# Patient Record
Sex: Female | Born: 1980 | Race: White | Hispanic: No | Marital: Single | Smoking: Former smoker
Health system: Southern US, Community
[De-identification: ages and names within clinical notes are randomized; demographics above are authoritative.]

## PROBLEM LIST (undated history)

## (undated) DIAGNOSIS — I10 Essential (primary) hypertension: Secondary | ICD-10-CM

## (undated) DIAGNOSIS — M1990 Unspecified osteoarthritis, unspecified site: Secondary | ICD-10-CM

## (undated) DIAGNOSIS — G43909 Migraine, unspecified, not intractable, without status migrainosus: Secondary | ICD-10-CM

## (undated) DIAGNOSIS — E119 Type 2 diabetes mellitus without complications: Secondary | ICD-10-CM

## (undated) DIAGNOSIS — K8020 Calculus of gallbladder without cholecystitis without obstruction: Secondary | ICD-10-CM

## (undated) DIAGNOSIS — K760 Fatty (change of) liver, not elsewhere classified: Secondary | ICD-10-CM

## (undated) HISTORY — DX: Fatty (change of) liver, not elsewhere classified: K76.0

## (undated) HISTORY — DX: Type 2 diabetes mellitus without complications: E11.9

## (undated) HISTORY — DX: Migraine, unspecified, not intractable, without status migrainosus: G43.909

## (undated) HISTORY — PX: TONSILLECTOMY: SHX5217

## (undated) HISTORY — DX: Calculus of gallbladder without cholecystitis without obstruction: K80.20

## (undated) HISTORY — DX: Essential (primary) hypertension: I10

## (undated) HISTORY — DX: Unspecified osteoarthritis, unspecified site: M19.90

---

## 2004-12-24 ENCOUNTER — Emergency Department (HOSPITAL_COMMUNITY): Admission: EM | Admit: 2004-12-24 | Discharge: 2004-12-24 | Payer: Self-pay | Admitting: Emergency Medicine

## 2005-01-06 ENCOUNTER — Encounter: Admission: RE | Admit: 2005-01-06 | Discharge: 2005-01-06 | Payer: Self-pay | Admitting: Internal Medicine

## 2009-10-12 ENCOUNTER — Encounter: Payer: Self-pay | Admitting: Family Medicine

## 2009-10-12 ENCOUNTER — Ambulatory Visit: Payer: Self-pay | Admitting: Family Medicine

## 2009-10-12 ENCOUNTER — Other Ambulatory Visit: Admission: RE | Admit: 2009-10-12 | Discharge: 2009-10-12 | Payer: Self-pay | Admitting: Family Medicine

## 2009-10-12 DIAGNOSIS — F17200 Nicotine dependence, unspecified, uncomplicated: Secondary | ICD-10-CM

## 2009-10-12 DIAGNOSIS — I10 Essential (primary) hypertension: Secondary | ICD-10-CM | POA: Insufficient documentation

## 2009-10-15 ENCOUNTER — Encounter (INDEPENDENT_AMBULATORY_CARE_PROVIDER_SITE_OTHER): Payer: Self-pay | Admitting: *Deleted

## 2009-11-21 ENCOUNTER — Ambulatory Visit (HOSPITAL_BASED_OUTPATIENT_CLINIC_OR_DEPARTMENT_OTHER): Admission: RE | Admit: 2009-11-21 | Discharge: 2009-11-21 | Payer: Self-pay | Admitting: Specialist

## 2010-05-21 ENCOUNTER — Ambulatory Visit: Payer: Self-pay | Admitting: Family Medicine

## 2010-05-21 DIAGNOSIS — J019 Acute sinusitis, unspecified: Secondary | ICD-10-CM

## 2010-08-14 ENCOUNTER — Telehealth (INDEPENDENT_AMBULATORY_CARE_PROVIDER_SITE_OTHER): Payer: Self-pay | Admitting: *Deleted

## 2010-10-15 ENCOUNTER — Ambulatory Visit: Payer: Self-pay | Admitting: Family Medicine

## 2010-10-15 ENCOUNTER — Other Ambulatory Visit: Admission: RE | Admit: 2010-10-15 | Discharge: 2010-10-15 | Payer: Self-pay | Admitting: Family Medicine

## 2010-10-21 LAB — CONVERTED CEMR LAB
AST: 27 units/L (ref 0–37)
Alkaline Phosphatase: 43 units/L (ref 39–117)
BUN: 9 mg/dL (ref 6–23)
Basophils Absolute: 0 10*3/uL (ref 0.0–0.1)
Calcium: 9 mg/dL (ref 8.4–10.5)
Creatinine, Ser: 0.7 mg/dL (ref 0.4–1.2)
Eosinophils Absolute: 0.2 10*3/uL (ref 0.0–0.7)
GFR calc non Af Amer: 99.9 mL/min (ref >60)
Glucose, Bld: 93 mg/dL (ref 70–99)
HDL: 37.3 mg/dL — ABNORMAL LOW (ref >39.00)
Hemoglobin: 13.8 g/dL (ref 12.0–15.0)
Lymphocytes Relative: 26.3 % (ref 12.0–46.0)
MCHC: 34.7 g/dL (ref 30.0–36.0)
Monocytes Relative: 4.1 % (ref 3.0–12.0)
Neutrophils Relative %: 67.1 % (ref 43.0–77.0)
Platelets: 264 10*3/uL (ref 150.0–400.0)
RDW: 12.6 % (ref 11.5–14.6)
Sodium: 142 meq/L (ref 135–145)
Total Bilirubin: 0.4 mg/dL (ref 0.3–1.2)
Triglycerides: 101 mg/dL (ref 0.0–149.0)
VLDL: 20.2 mg/dL (ref 0.0–40.0)

## 2010-10-29 HISTORY — PX: KNEE ARTHROSCOPY: SHX127

## 2010-11-07 ENCOUNTER — Telehealth (INDEPENDENT_AMBULATORY_CARE_PROVIDER_SITE_OTHER): Payer: Self-pay | Admitting: *Deleted

## 2010-11-19 ENCOUNTER — Telehealth (INDEPENDENT_AMBULATORY_CARE_PROVIDER_SITE_OTHER): Payer: Self-pay | Admitting: *Deleted

## 2011-01-28 NOTE — Assessment & Plan Note (Signed)
Summary: cpx/pap/fasting//kn   Vital Signs:  Patient profile:   30 year old female Menstrual status:  regular LMP:     09/24/2010 Height:      67 inches Weight:      243.4 pounds BMI:     38.26 Temp:     98.8 degrees F oral Pulse rate:   92 / minute Pulse rhythm:   regular BP sitting:   126 / 90  (right arm) Cuff size:   large  Vitals Entered By: Almeta Monas CMA Duncan Dull) (October 15, 2010 8:38 AM) CC: CPX/Fasting and Pap LMP (date): 09/24/2010     Menstrual Status regular Enter LMP: 09/24/2010 Last PAP Result NEGATIVE FOR INTRAEPITHELIAL LESIONS OR MALIGNANCY.   History of Present Illness: Pt here for cpe, labs and pap.  No complaints.    Preventive Screening-Counseling & Management  Alcohol-Tobacco     Alcohol drinks/day: <1     Smoking Status: current     Smoking Cessation Counseling: YES     Smoke Cessation Stage: precontemplative     Packs/Day: 0.5     Year Started: 2000  Caffeine-Diet-Exercise     Caffeine use/day: 32 oz cup a day     Diet Comments: not following diet     Does Patient Exercise: no     Exercise Counseling: to improve exercise regimen  Hep-HIV-STD-Contraception     Dental Visit-last 6 months yes     Dental Care Counseling: not indicated; dental care within six months     SBE monthly: yes     SBE Education/Counseling: not indicated; SBE done regularly  Safety-Violence-Falls     Seat Belt Use: yes      Sexual History:  same sex encounters and One partner.        Drug Use:  no.    Current Medications (verified): 1)  Benicar Hct 20-12.5 Mg Tabs (Olmesartan Medoxomil-Hctz) .Marland Kitchen.. 1 By Mouth Once Daily 2)  Low-Ogestrel 0.3-30 Mg-Mcg Tabs (Norgestrel-Ethinyl Estradiol) .Marland Kitchen.. 1 By Mouth Daily. 3)  Claritin-D 12 Hour 5-120 Mg Xr12h-Tab (Loratadine-Pseudoephedrine) .Marland Kitchen.. 1 By Mouth Daily 4)  Multivitamins  Tabs (Multiple Vitamin) .Marland Kitchen.. 1 By Mouth Daily 5)  Fish Oil 1200 Mg Caps (Omega-3 Fatty Acids) .Marland Kitchen.. 1 By Mouth Daily.  Allergies  (verified): 1)  ! Keflex 2)  ! 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 Heart Disease  Family History of Alcoholism/Addiction Family History of Arthritis Family History Diabetes 1st degree relative Family History High cholesterol Family History Hypertension Family History Lung cancer Family History of Stroke F 1st degree relative <60  Social History: Reviewed history from 10/12/2009 and no changes required. 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 Eyes:  Denies blurring, discharge, double vision, eye irritation, eye pain, halos, itching, light sensitivity, red eye, vision loss-1 eye, and vision loss-both eyes; optho q1y-- + contacts. ENT:  Denies decreased hearing, difficulty swallowing, ear discharge, earache,  hoarseness, nasal congestion, nosebleeds, postnasal drainage, ringing in ears, sinus pressure, and sore throat. CV:  Denies bluish discoloration of lips or nails, chest pain or discomfort, difficulty breathing at night, difficulty breathing while lying down, fainting, fatigue, leg cramps with exertion, lightheadness, near fainting, palpitations, shortness of breath with exertion, swelling of feet, swelling of hands, and weight gain. Resp:  Denies chest discomfort, chest pain with inspiration, cough, coughing up blood, excessive snoring, hypersomnolence, morning headaches, pleuritic, shortness of breath, sputum productive, and wheezing. GI:  Denies abdominal pain, bloody stools, change in bowel habits, constipation, dark tarry stools, diarrhea, excessive appetite, gas, hemorrhoids, indigestion, loss of appetite, nausea, vomiting, vomiting blood, and yellowish skin color. GU:  Denies abnormal vaginal bleeding, decreased libido, discharge, dysuria, genital sores, hematuria, incontinence, nocturia, urinary frequency, and urinary hesitancy. MS:  Denies joint pain, joint redness, joint swelling, loss of strength, low back pain, mid back pain, muscle aches, muscle , cramps, muscle weakness, stiffness, and thoracic pain. Derm:  Denies changes in color of skin, changes in nail beds, dryness, excessive perspiration, flushing, hair loss, insect bite(s), itching, lesion(s), poor wound healing, and rash. Neuro:  Denies brief paralysis, difficulty with concentration, disturbances in coordination, falling down, headaches, inability to speak, memory loss, numbness, poor balance, seizures, sensation of room spinning, tingling, tremors, visual disturbances, and weakness. Psych:  Denies alternate hallucination ( auditory/visual), anxiety, depression, easily angered, easily tearful, irritability, mental problems, panic attacks, sense of great danger, suicidal thoughts/plans, thoughts of violence, unusual visions or sounds, and  thoughts /plans of harming others. Endo:  Denies cold intolerance, excessive hunger, excessive thirst, excessive urination, heat intolerance, polyuria, and weight change. Heme:  Denies abnormal bruising, bleeding, enlarge lymph nodes, fevers, pallor, and skin discoloration. Allergy:  Denies hives or rash, itching eyes, persistent infections, seasonal allergies, and sneezing.  Physical Exam  General:  Well-developed,well-nourished,in no acute distress; alert,appropriate and cooperative throughout examination Head:  Normocephalic and atraumatic without obvious abnormalities. No apparent alopecia or balding. Eyes:  vision grossly intact, pupils equal, pupils round, pupils reactive to light, and no injection.   Ears:  External ear exam shows no significant lesions or deformities.  Otoscopic examination reveals clear canals, tympanic membranes are intact bilaterally without bulging, retraction, inflammation or discharge. Hearing is grossly normal bilaterally. Nose:  External nasal examination shows no deformity or inflammation. Nasal mucosa are pink and moist without lesions or exudates. Mouth:  Oral mucosa and oropharynx without lesions or exudates.  Teeth in good repair. Neck:  No deformities, masses, or tenderness noted. Chest Wall:  No deformities, masses, or tenderness noted. Breasts:  No mass, nodules, thickening, tenderness, bulging, retraction, inflamation, nipple discharge or skin changes noted.   Lungs:  Normal respiratory effort, chest expands symmetrically. Lungs are clear to auscultation, no crackles or wheezes. Heart:  normal rate and no murmur.   Abdomen:  Bowel sounds positive,abdomen soft and non-tender without masses, organomegaly or hernias noted. Genitalia:  Pelvic Exam:        External: normal female genitalia without lesions or masses        Vagina: normal without lesions or masses  Cervix: normal without lesions or masses        Adnexa: normal bimanual exam without  masses or fullness        Uterus: normal by palpation        Pap smear: performed Msk:  normal ROM, no joint tenderness, no joint swelling, no joint warmth, no redness over joints, no joint deformities, no joint instability, and no crepitation.   Pulses:  R and L carotid,radial,femoral,dorsalis pedis and posterior tibial pulses are full and equal bilaterally Extremities:  No clubbing, cyanosis, edema, or deformity noted with normal full range of motion of all joints.   Neurologic:  No cranial nerve deficits noted. Station and gait are normal. Plantar reflexes are down-going bilaterally. DTRs are symmetrical throughout. Sensory, motor and coordinative functions appear intact. Skin:  Intact without suspicious lesions or rashes Cervical Nodes:  No lymphadenopathy noted Axillary Nodes:  No palpable lymphadenopathy Psych:  Cognition and judgment appear intact. Alert and cooperative with normal attention span and concentration. No apparent delusions, illusions, hallucinations   Impression & Recommendations:  Problem # 1:  PREVENTIVE HEALTH CARE (ICD-V70.0) check labs ghm utd  Orders: Venipuncture ([address]109) TLB-Lipid Panel (80061-LIPID) TLB-BMP (Basic Metabolic Panel-BMET) (80048-METABOL) TLB-CBC Platelet - w/Differential (85025-CBCD) TLB-Hepatic/Liver Function Pnl (80076-HEPATIC) TLB-TSH (Thyroid Stimulating Hormone) (84443-TSH)  Problem # 2:  HYPERTENSION (ICD-401.9)  Her updated medication list for this problem includes:    Benicar Hct 20-12.5 Mg Tabs (Olmesartan medoxomil-hctz) .Marland Kitchen... 1 by mouth once daily  Orders: Venipuncture (60454) TLB-Lipid Panel (80061-LIPID) TLB-BMP (Basic Metabolic Panel-BMET) (80048-METABOL) TLB-CBC Platelet - w/Differential (85025-CBCD) TLB-Hepatic/Liver Function Pnl (80076-HEPATIC) TLB-TSH (Thyroid Stimulating Hormone) (84443-TSH)  BP today: 126/90 Prior BP: 122/74 (05/21/2010)  Problem # 3:  TOBACCO USER (ICD-305.1)  The following medications were  removed from the medication list:    Chantix Starting Month Pak 0.5 Mg X 11 & 1 Mg X 42 Tabs (Varenicline tartrate) .Marland Kitchen... As directed  Encouraged smoking cessation and discussed different methods for smoking cessation.   Orders: Tobacco use cessation intermediate 3-10 minutes (99406)  Complete Medication List: 1)  Benicar Hct 20-12.5 Mg Tabs (Olmesartan medoxomil-hctz) .Marland Kitchen.. 1 by mouth once daily 2)  Low-ogestrel 0.3-30 Mg-mcg Tabs (Norgestrel-ethinyl estradiol) .Marland Kitchen.. 1 by mouth daily. 3)  Claritin-d 12 Hour 5-120 Mg Xr12h-tab (Loratadine-pseudoephedrine) .Marland Kitchen.. 1 by mouth daily 4)  Multivitamins Tabs (Multiple vitamin) .Marland Kitchen.. 1 by mouth daily 5)  Fish Oil 1200 Mg Caps (Omega-3 fatty acids) .Marland Kitchen.. 1 by mouth daily.  Patient Instructions: 1)  Please schedule a follow-up appointment 6 months Prescriptions: BENICAR HCT 20-12.5 MG TABS (OLMESARTAN MEDOXOMIL-HCTZ) 1 by mouth once daily  #90 x 3   Entered and Authorized by:   Loreen Freud DO   Signed by:   Loreen Freud DO on 10/15/2010   Method used:   [facility]        [facility] [address] [phone]* (retail)       [address]       [place], [place]  [address]       Ph: [phone] or [phone]       Fax: [phone]   RxID:   [id number] BENICAR HCT 40-25 MG TABS (OLMESARTAN MEDOXOMIL-HCTZ) 1 by mouth once daily  #30 x 2   Entered and Authorized by:   Loreen Freud DO   Signed by:   Loreen Freud DO on 10/15/2010   Method used:   Print then Give to Patient   RxID:   [id number]  Orders Added: 1)  Venipuncture [36415] 2)  TLB-Lipid Panel [80061-LIPID] 3)  TLB-BMP (Basic Metabolic Panel-BMET) [80048-METABOL] 4)  TLB-CBC Platelet - w/Differential [85025-CBCD] 5)  TLB-Hepatic/Liver Function Pnl [80076-HEPATIC] 6)  TLB-TSH (Thyroid Stimulating Hormone) [84443-TSH] 7)  Est. Patient 18-39 years [99395] 8)  Tobacco use cessation intermediate 3-10 minutes [99406]    Flu Vaccine Next Due:   Refused

## 2011-01-28 NOTE — Assessment & Plan Note (Signed)
Summary: sinus pressure//congestion//lch   Vital Signs:  Patient profile:   30 year old female Weight:      245 pounds Temp:     98.5 degrees F oral Pulse rate:   96 / minute Pulse rhythm:   regular BP sitting:   122 / 74  (left arm) Cuff size:   regular  Vitals Entered By: Army Fossa CMA (May 21, 2010 9:48 AM) CC: Pt here c/o HA, drainage in her throat, Head Congestion, Ears ache x 2 days. Has tried Sudafed-d, URI symptoms   History of Present Illness:       This is a 30 year old woman who presents with URI symptoms.  The symptoms began 1 week ago.  Pt taking claritin with no relief.  The patient complains of nasal congestion, purulent nasal discharge, sore throat, productive cough, earache, and sick contacts, but denies dry cough.  Associated symptoms include fever.  The patient also reports headache.  The patient denies itchy watery eyes, itchy throat, sneezing, seasonal symptoms, response to antihistamine, muscle aches, and severe fatigue.  The patient denies the following risk factors for Strep sinusitis: unilateral facial pain, unilateral nasal discharge, poor response to decongestant, double sickening, tooth pain, Strep exposure, tender adenopathy, and absence of cough.   Pt c/o B/L sinus pressure and teeth hurt.    Preventive Screening-Counseling & Management  Alcohol-Tobacco     Alcohol drinks/day: <1     Smoking Status: current     Smoking Cessation Counseling: YES     Smoke Cessation Stage: precontemplative     Packs/Day: 1.0     Year Started: 2000  Caffeine-Diet-Exercise     Caffeine use/day: 32 oz cup a day     Diet Comments: not following diet     Does Patient Exercise: no     Exercise Counseling: not indicated; exercise is adequate  Allergies: 1)  ! Keflex 2)  ! Pcn  Past History:  Past medical, surgical, family and social histories (including risk factors) reviewed for relevance to current acute and chronic problems.  Past Medical History: Reviewed  history from 10/12/2009 and no changes required. Arthritis High Blood Pressure Readings Migraines  Hypertension  Past Surgical History: Reviewed history from 10/12/2009 and no changes required. Tonsillectomy Discoid Miniscus  Family History: Reviewed history from 10/12/2009 and no changes required. Heart Disease  Family History of Alcoholism/Addiction Family History of Arthritis Family History Diabetes 1st degree relative Family History High cholesterol Family History Hypertension Family History Lung cancer Family History of Stroke F 1st degree relative <60  Social History: Reviewed history from 10/12/2009 and no changes required. 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  Otoscopic examination reveals clear canals, tympanic membranes are intact bilaterally without bulging, retraction, inflammation or discharge. Hearing is grossly normal bilaterally. Nose:  L frontal sinus tenderness, L maxillary sinus tenderness, R frontal sinus tenderness, and R maxillary sinus tenderness.   Mouth:  Oral mucosa and oropharynx without lesions or exudates.  Teeth in good repair. Neck:  No deformities, masses, or tenderness noted. Lungs:  Normal respiratory effort, chest expands symmetrically. Lungs are clear to auscultation, no crackles or wheezes. Heart:  normal rate and no murmur.   Psych:  Oriented X3 and normally interactive.  Impression & Recommendations:  Problem # 1:  SINUSITIS - ACUTE-NOS (ICD-461.9)  Her updated medication list for this problem includes:    Claritin-d 12 Hour 5-120 Mg Xr12h-tab (Loratadine-pseudoephedrine) .Marland Kitchen... 1 by mouth daily    Biaxin Xl 500 Mg Xr24h-tab  (Clarithromycin) .Marland Kitchen... 2 by mouth once daily with the biggest meal of the day    Nasonex 50 Mcg/act Susp (Mometasone furoate) .Marland Kitchen... 2 sprays each nostril once daily  Instructed on treatment. Call if symptoms persist or worsen.   Orders: Tobacco use cessation intermediate 3-10 minutes (40981)  Problem # 2:  TOBACCO USER (ICD-305.1)  Her updated medication list for this problem includes:    Chantix Starting Month Pak 0.5 Mg X 11 & 1 Mg X 42 Tabs (Varenicline tartrate) .Marland Kitchen... As directed  Encouraged smoking cessation and discussed different methods for smoking cessation.   Orders: Tobacco use cessation intermediate 3-10 minutes (99406)  Complete Medication List: 1)  Benicar Hct 20-12.5 Mg Tabs (Olmesartan medoxomil-hctz) .Marland Kitchen.. 1 by mouth once daily. 2)  Low-ogestrel 0.3-30 Mg-mcg Tabs (Norgestrel-ethinyl estradiol) .Marland Kitchen.. 1 by mouth daily. 3)  Claritin-d 12 Hour 5-120 Mg Xr12h-tab (Loratadine-pseudoephedrine) .Marland Kitchen.. 1 by mouth daily 4)  Multivitamins Tabs (Multiple vitamin) .Marland Kitchen.. 1 by mouth daily 5)  Fish Oil 1200 Mg Caps (Omega-3 fatty acids) .Marland Kitchen.. 1 by mouth daily. 6)  Biaxin Xl 500 Mg Xr24h-tab (Clarithromycin) .... 2 by mouth once daily with the biggest meal of the day 7)  Nasonex 50 Mcg/act Susp (Mometasone furoate) .... 2 sprays each nostril once daily 8)  Chantix Starting Month Pak 0.5 Mg X 11 & 1 Mg X 42 Tabs (Varenicline tartrate) .... As directed           Patient Instructions: 1)  Tobacco is very bad for your health and your loved ones ! You should stop smoking !    If you decide to take the chantix--make an appointment for 4 weeks later.  You can do It!!! 2)  Acute sinusitis symptoms for less than 10 days are not helped by antibiotics. Use warm moist compresses, and over the counter decongestants( only as directed). Call if no improvement in 5-7 days, sooner if increasing pain, fever, or new symptoms.  Prescriptions: CHANTIX STARTING MONTH PAK 0.5 MG X 11 & 1 MG X 42 TABS  (VARENICLINE TARTRATE) as directed  #1 x 0   Entered and Authorized by:   Loreen Freud DO   Signed by:   Loreen Freud DO on 05/21/2010   Method used:   Print then Give to Patient   RxID:   [id number] BIAXIN XL 500 MG XR24H-TAB (CLARITHROMYCIN) 2 by mouth once daily with the biggest meal of the day  #28 x 0   Entered and Authorized by:   Loreen Freud DO   Signed by:   Loreen Freud DO on 05/21/2010   Method used:   [facility]        [facility] [address] [phone]* (retail)       [address]       [place], [place]  [address]       Ph: [phone] or [phone]       Fax: [phone]   RxID:   [id number]

## 2011-01-28 NOTE — Progress Notes (Signed)
Summary: benicar refill   Phone Note Refill Request Call back at Home Phone [phone] Message from:  Patient on November 19, 2010 10:53 AM  Refills Requested: Medication #1:  BENICAR HCT 20-12.5 MG TABS 1 by mouth once daily   Dosage confirmed as above?Dosage Confirmed   Supply Requested: [facility] month [facility] [address] [address]  Next Appointment Scheduled: NONE Initial call taken by: Lavell Islam,  November 19, 2010 10:54 AM    Prescriptions: BENICAR HCT 20-12.5 MG TABS (OLMESARTAN MEDOXOMIL-HCTZ) 1 by mouth once daily  #90 x 0   Entered by:   Doristine Devoid CMA   Authorized by:   Loreen Freud DO   Signed by:   Doristine Devoid CMA on 11/19/2010   Method used:   Electronically to        [facility] [address] [phone]* (retail)       [address]       [place], [place]  [address]       Ph: [phone] or [phone]       Fax: [phone]   RxID:   [id number]

## 2011-01-28 NOTE — Progress Notes (Signed)
Summary: Refill Request  Phone Note Refill Request Message from:  Patient on August 14, 2010 12:50 PM  Refills Requested: Medication #1:  BENICAR HCT 20-12.5 MG TABS 1 by mouth once daily.   Dosage confirmed as above?Dosage Confirmed   Supply Requested: 1 month  Medication #2:  LOW-OGESTREL 0.3-30 MG-MCG TABS 1 by mouth daily.   Dosage confirmed as above?Dosage Confirmed   [facility]ly Requested: [facility] [address]  Next Appointment Scheduled: 10/15/10 Initial call taken by: Lavell Islam,  August 14, 2010 12:51 PM  Follow-up for Phone Call        spk with Pharmacy about rf on OCP and they adv that they were filled 11 times already and pt was out of refills...Marland KitchenMarland KitchenMarland Kitchen Almeta Monas CMA Duncan Dull)  August 14, 2010 1:58 PM     Prescriptions: LOW-OGESTREL 0.3-30 MG-MCG TABS (NORGESTREL-ETHINYL ESTRADIOL) 1 by mouth daily.  #1 x 3   Entered by:   Almeta Monas CMA (AAMA)   Authorized by:   Loreen Freud DO   Signed by:   Almeta Monas CMA (AAMA) on 08/14/2010   Method used:   Faxed to ...       [facility] [address] [phone]* (retail)       [address]       [place], [place]  [address]       Ph: [phone] or [phone]       Fax: [phone]   RxID:   [id number] BENICAR HCT 20-12.5 MG TABS (OLMESARTAN MEDOXOMIL-HCTZ) 1 by mouth once daily.  #30 Tablet x 4   Entered by:   Almeta Monas CMA (AAMA)   Authorized by:   Loreen Freud DO   Signed by:   Almeta Monas CMA (AAMA) on 08/14/2010   Method used:   Faxed to ...       [facility] [address] [phone]* (retail)       [address]       [place], [place]  [address]       Ph: [phone] or [phone]       Fax: [phone]   RxID:   [id number]

## 2011-01-28 NOTE — Progress Notes (Signed)
Summary: rx for chantix  Phone Note Call from Patient Call back at Home Phone [phone]   Caller: Patient Summary of Call: patient discussed chantix with dr Laury Axon -she [facility] [address] Initial call taken by: Okey Regal [place],  November 07, 2010 4:46 PM    New/Updated Medications: CHANTIX STARTING MONTH PAK 0.5 MG X 11 & 1 MG X 42 TABS (VARENICLINE TARTRATE) use as directed Prescriptions: CHANTIX STARTING MONTH PAK 0.5 MG X 11 & 1 MG X 42 TABS (VARENICLINE TARTRATE) use as directed  #1 x 0   Entered by:   Almeta Monas CMA (AAMA)   Authorized by:   Loreen Freud DO   Signed by:   Almeta Monas CMA (AAMA) on 11/07/2010   Method used:   Electronically to        [facility] [address] [phone]* (retail)       [address]       [place], [place]  [address]       Ph: [phone] or [phone]       Fax: [phone]   RxID:   [id number]

## 2011-02-25 ENCOUNTER — Telehealth (INDEPENDENT_AMBULATORY_CARE_PROVIDER_SITE_OTHER): Payer: Self-pay | Admitting: *Deleted

## 2011-03-05 ENCOUNTER — Ambulatory Visit (INDEPENDENT_AMBULATORY_CARE_PROVIDER_SITE_OTHER): Payer: Managed Care, Other (non HMO) | Admitting: Family Medicine

## 2011-03-05 ENCOUNTER — Other Ambulatory Visit: Payer: Self-pay | Admitting: Family Medicine

## 2011-03-05 ENCOUNTER — Ambulatory Visit (INDEPENDENT_AMBULATORY_CARE_PROVIDER_SITE_OTHER)
Admission: RE | Admit: 2011-03-05 | Discharge: 2011-03-05 | Disposition: A | Discharge status: Home / Self Care | Payer: Managed Care, Other (non HMO) | Source: Ambulatory Visit | Attending: Family Medicine | Admitting: Family Medicine

## 2011-03-05 ENCOUNTER — Encounter: Payer: Self-pay | Admitting: Family Medicine

## 2011-03-05 DIAGNOSIS — R059 Cough, unspecified: Secondary | ICD-10-CM

## 2011-03-05 DIAGNOSIS — R05 Cough: Secondary | ICD-10-CM

## 2011-03-05 DIAGNOSIS — Z87891 Personal history of nicotine dependence: Secondary | ICD-10-CM

## 2011-03-05 DIAGNOSIS — I10 Essential (primary) hypertension: Secondary | ICD-10-CM

## 2011-03-05 DIAGNOSIS — J019 Acute sinusitis, unspecified: Secondary | ICD-10-CM

## 2011-03-06 NOTE — Progress Notes (Signed)
Summary: refill  Phone Note Refill Request   Refills Requested: Medication #1:  BENICAR HCT 20-12.5 MG TABS 1 by mouth once daily  Medication #2:  LOW-OGESTREL 0.3-30 MG-MCG TABS 1 by mouth daily.    Prescriptions: LOW-OGESTREL 0.3-30 MG-MCG TABS (NORGESTREL-ETHINYL ESTRADIOL) 1 by mouth daily.  #28 Tablet x 8   Entered by:   Jeremy Johann CMA   Authorized by:   Loreen Freud DO   Signed by:   Jeremy Johann CMA on 02/25/2011   Method used:   Re-Faxed to ...       [facility] [address] [phone]* (retail)       [address]       [place], [place]  [address]       Ph: [phone] or [phone]       Fax: [phone]   RxID:   [id number] BENICAR HCT 20-12.5 MG TABS (OLMESARTAN MEDOXOMIL-HCTZ) 1 by mouth once daily  #90 x 2   Entered by:   Jeremy Johann CMA   Authorized by:   Loreen Freud DO   Signed by:   Jeremy Johann CMA on 02/25/2011   Method used:   Re-Faxed to ...       [facility] [address] [phone]* (retail)       [address]       [place], [place]  [address]       Ph: [phone] or [phone]       Fax: [phone]   RxID:   [id number]

## 2011-03-11 NOTE — Assessment & Plan Note (Signed)
Summary: cough/congested/cbs   Vital Signs:  Patient profile:   30 year old female Menstrual status:  regular Weight:      253.4 pounds Temp:     97.5 degrees F oral BP sitting:   124 / 86  (right arm) Cuff size:   large  Vitals Entered By: Almeta Monas CMA Duncan Dull) (March 05, 2011 11:52 AM) CC: c/o URI X3 days with green phelgm, Cough   History of Present Illness:  Cough      This is a 30 year old woman who presents with Cough.  The symptoms began 3 days ago.  + cough and nasal congestion for 3-4 days.  Cough since Novemer.  The patient reports productive cough, but denies non-productive cough, pleuritic chest pain, shortness of breath, wheezing, exertional dyspnea, fever, hemoptysis, and malaise.  Associated symtpoms include cold/URI symptoms, sore throat, and nasal congestion.  The patient denies the following symptoms: chronic rhinitis, weight loss, acid reflux symptoms, and peripheral edema.  The cough is worse with activity and lying down.  Ineffective prior treatments have included OTC cough medication.    Preventive Screening-Counseling & Management  Alcohol-Tobacco     Smoking Status: quit     Smoke Cessation Stage: quit     Year Quit: 2011  Current Medications (verified): 1)  Benicar Hct 20-12.5 Mg Tabs (Olmesartan Medoxomil-Hctz) .Marland Kitchen.. 1 By Mouth Once Daily 2)  Low-Ogestrel 0.3-30 Mg-Mcg Tabs (Norgestrel-Ethinyl Estradiol) .Marland Kitchen.. 1 By Mouth Daily. 3)  Claritin-D 12 Hour 5-120 Mg Xr12h-Tab (Loratadine-Pseudoephedrine) .Marland Kitchen.. 1 By Mouth Daily 4)  Multivitamins  Tabs (Multiple Vitamin) .Marland Kitchen.. 1 By Mouth Daily 5)  Fish Oil 1200 Mg Caps (Omega-3 Fatty Acids) .Marland Kitchen.. 1 By Mouth Daily. 6)  Biaxin Xl 500 Mg Xr24h-Tab (Clarithromycin) .... 2 By Mouth Once Daily 7)  Mucinex Maximum Strength 1200 Mg Xr12h-Tab (Guaifenesin)  Allergies (verified): 1)  ! Keflex 2)  ! Pcn  Past History:  Past medical, surgical, family and social histories (including risk factors) reviewed for relevance  to current acute and chronic problems.  Past Medical History: Reviewed history from 10/12/2009 and no changes required. Arthritis High Blood Pressure Readings Migraines  Hypertension  Past Surgical History: Reviewed history from 10/15/2010 and no changes required. Tonsillectomy Discoid Miniscus arthroscopic R knee  10/2010  Family History: Reviewed history from 10/12/2009 and no changes required. Heart Disease  Family History of Alcoholism/Addiction Family History of Arthritis Family History Diabetes 1st degree relative Family History High cholesterol Family History Hypertension Family History Lung cancer Family History of Stroke F 1st degree relative <60  Social History: Reviewed history from 10/12/2009 and no changes required. 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  Otoscopic examination reveals clear canals, tympanic membranes are intact bilaterally without bulging, retraction, inflammation or discharge. Hearing is grossly normal bilaterally. Nose:  no external deformity, L frontal sinus tenderness, L maxillary sinus tenderness, R frontal sinus tenderness, and R maxillary sinus tenderness.   Mouth:  pharyngeal erythema and postnasal drip.   Neck:  No deformities, masses, or tenderness noted. Lungs:  Normal respiratory effort, chest expands symmetrically. Lungs are clear to auscultation, no crackles or wheezes. Heart:  normal rate and no murmur.     Impression & Recommendations:  Problem #  1:  SINUSITIS - ACUTE-NOS (ICD-461.9)  Her updated medication list for this problem includes:    Claritin-d 12 Hour 5-120 Mg Xr12h-tab  (Loratadine-pseudoephedrine) .Marland Kitchen... 1 by mouth daily    Biaxin Xl 500 Mg Xr24h-tab (Clarithromycin) .Marland Kitchen... 2 by mouth once daily    Mucinex Maximum Strength 1200 Mg Xr12h-tab (Guaifenesin)  Problem # 2:  COUGH (ICD-786.2)  Orders: T-2 View CXR (71020TC)  Problem # 3:  TOBACCO USE, QUIT (ICD-V15.82)  Complete Medication List: 1)  Benicar Hct 20-12.5 Mg Tabs (Olmesartan medoxomil-hctz) .Marland Kitchen.. 1 by mouth once daily 2)  Low-ogestrel 0.3-30 Mg-mcg Tabs (Norgestrel-ethinyl estradiol) .Marland Kitchen.. 1 by mouth daily. 3)  Claritin-d 12 Hour 5-120 Mg Xr12h-tab (Loratadine-pseudoephedrine) .Marland Kitchen.. 1 by mouth daily 4)  Multivitamins Tabs (Multiple vitamin) .Marland Kitchen.. 1 by mouth daily 5)  Fish Oil 1200 Mg Caps (Omega-3 fatty acids) .Marland Kitchen.. 1 by mouth daily. 6)  Biaxin Xl 500 Mg Xr24h-tab (Clarithromycin) .... 2 by mouth once daily 7)  Mucinex Maximum Strength 1200 Mg Xr12h-tab (Guaifenesin) Prescriptions: BIAXIN XL 500 MG XR24H-TAB (CLARITHROMYCIN) 2 by mouth once daily  #28 x 0   Entered and Authorized by:   Loreen Freud DO   Signed by:   Loreen Freud DO on 03/05/2011   Method used:   Electronically to        [facility] [address] [phone]* (retail)       [address]       [place], [place]  [address]       Ph: [phone] or [phone]       Fax: [phone]   RxID:   [id number]    Orders Added: 1)  T-2 View CXR [71020TC] 2)  Est. Patient Level III [95188]

## 2011-04-02 LAB — POCT I-STAT 4, (NA,K, GLUC, HGB,HCT)
Glucose, Bld: 104 mg/dL — ABNORMAL HIGH (ref 70–99)
Hemoglobin: 14.6 g/dL (ref 12.0–15.0)
Potassium: 3.8 mEq/L (ref 3.5–5.1)
Sodium: 141 mEq/L (ref 135–145)

## 2011-04-02 LAB — POCT PREGNANCY, URINE: Preg Test, Ur: NEGATIVE

## 2011-05-21 ENCOUNTER — Ambulatory Visit (INDEPENDENT_AMBULATORY_CARE_PROVIDER_SITE_OTHER): Payer: Managed Care, Other (non HMO) | Admitting: Family Medicine

## 2011-05-21 ENCOUNTER — Encounter: Payer: Self-pay | Admitting: Family Medicine

## 2011-05-21 VITALS — BP 130/90 | HR 116 | Temp 98.9°F

## 2011-05-21 DIAGNOSIS — J029 Acute pharyngitis, unspecified: Secondary | ICD-10-CM

## 2011-05-21 DIAGNOSIS — B9789 Other viral agents as the cause of diseases classified elsewhere: Secondary | ICD-10-CM

## 2011-05-21 NOTE — Progress Notes (Signed)
  Subjective:    Patient ID: Monica Perkins, female    DOB: [birth year], 30 y.o.   MRN: [id number]  HPI Sore throat- sxs started this AM.  Pain w/ swallowing.  Associated laryngitis.  No fevers.  No nasal congestion, cough.  Bilateral ear pain w/ swallowing.  + sick contacts.  Denies sinus pain/pressure.   Review of Systems For ROS see HPI     Objective:   Physical Exam  Constitutional: She appears well-developed and well-nourished. No distress.  HENT:  Head: Normocephalic and atraumatic.  Nose: Nose normal.  Mouth/Throat: Oropharynx is clear and moist. No oropharyngeal exudate.       TMs normal bilaterally No PND, no TTP over sinuses  Eyes: Conjunctivae and EOM are normal. Pupils are equal, round, and reactive to light.  Neck: Normal range of motion. Neck supple.  Cardiovascular: Normal rate, regular rhythm and normal heart sounds.   Pulmonary/Chest: Effort normal and breath sounds normal. No respiratory distress. She has no wheezes.  Lymphadenopathy:    She has no cervical adenopathy.          Assessment & Plan:

## 2011-05-21 NOTE — Patient Instructions (Signed)
This appears to be a virus and should improve w/ time Ibuprofen for sore throat (2 tabs every 6-8hrs) Drink plenty of fluids Vocal rest! Call with any questions or concerns Enjoy your vacation!!

## 2011-05-21 NOTE — Assessment & Plan Note (Signed)
Most likely viral.  No evidence of bacterial infxn on exam and rapid strep (-).  Reviewed supportive care and red flags that should prompt return.  Pt expressed understanding and is in agreement w/ plan.

## 2011-05-22 ENCOUNTER — Telehealth: Payer: Self-pay | Admitting: *Deleted

## 2011-05-22 MED ORDER — AZITHROMYCIN 250 MG PO TABS: 250.0000 mg | ORAL_TABLET | Freq: Every day | ORAL | Status: DC

## 2011-05-22 NOTE — Telephone Encounter (Signed)
She can have a Zpack although it is still most likely viral and can take 7-10 days to improve.

## 2011-05-22 NOTE — Telephone Encounter (Signed)
Pt seen on yesterday now c/o yellowish/green mucous. Pt request med to be called in.Please advise

## 2011-05-22 NOTE — Telephone Encounter (Signed)
I spoke w/ pt she is aware- called in Zpack.

## 2011-07-04 ENCOUNTER — Other Ambulatory Visit: Payer: Self-pay | Admitting: Orthopedic Surgery

## 2011-07-04 DIAGNOSIS — M25561 Pain in right knee: Secondary | ICD-10-CM

## 2011-07-07 ENCOUNTER — Ambulatory Visit
Admission: RE | Admit: 2011-07-07 | Discharge: 2011-07-07 | Disposition: A | Discharge status: Home / Self Care | Payer: Managed Care, Other (non HMO) | Source: Ambulatory Visit | Attending: Orthopedic Surgery | Admitting: Orthopedic Surgery

## 2011-07-07 DIAGNOSIS — M25561 Pain in right knee: Secondary | ICD-10-CM

## 2011-09-05 ENCOUNTER — Ambulatory Visit (HOSPITAL_BASED_OUTPATIENT_CLINIC_OR_DEPARTMENT_OTHER)
Admission: RE | Admit: 2011-09-05 | Discharge: 2011-09-05 | Disposition: A | Discharge status: Home / Self Care | Payer: Managed Care, Other (non HMO) | Source: Ambulatory Visit | Attending: Orthopedic Surgery | Admitting: Orthopedic Surgery

## 2011-09-05 DIAGNOSIS — Z01812 Encounter for preprocedural laboratory examination: Secondary | ICD-10-CM | POA: Insufficient documentation

## 2011-09-05 DIAGNOSIS — I10 Essential (primary) hypertension: Secondary | ICD-10-CM | POA: Insufficient documentation

## 2011-09-05 DIAGNOSIS — M23305 Other meniscus derangements, unspecified medial meniscus, unspecified knee: Secondary | ICD-10-CM | POA: Insufficient documentation

## 2011-09-05 DIAGNOSIS — F17200 Nicotine dependence, unspecified, uncomplicated: Secondary | ICD-10-CM | POA: Insufficient documentation

## 2011-09-05 DIAGNOSIS — M2240 Chondromalacia patellae, unspecified knee: Secondary | ICD-10-CM | POA: Insufficient documentation

## 2011-09-05 LAB — POCT I-STAT 4, (NA,K, GLUC, HGB,HCT)
HCT: 42 % (ref 36.0–46.0)
Hemoglobin: 14.3 g/dL (ref 12.0–15.0)
Potassium: 3.7 mEq/L (ref 3.5–5.1)
Sodium: 139 mEq/L (ref 135–145)

## 2011-09-16 NOTE — Op Note (Signed)
  NAME:  Monica Perkins, Monica Perkins                ACCOUNT NO.:  [id number]  MEDICAL RECORD NO.:  [id number]  LOCATION:                                 FACILITY:  PHYSICIAN:  Marlowe Kays, M.D.       DATE OF BIRTH:  DATE OF PROCEDURE: DATE OF DISCHARGE:                              OPERATIVE REPORT   PREOPERATIVE DIAGNOSIS:  Torn medial meniscus, right knee.  POSTOPERATIVE DIAGNOSES: 1. Torn medial meniscus. 2. 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  OPERATIONS: 1. Right knee arthroscopy. 2. Partial medial meniscectomy. 3. Shaving of medial femoral condyle. 4. Shaving of patella.  SURGEON:  Marlowe Kays, M.D.  ASSISTANT:  None.  ANESTHESIA:  General.  PATHOLOGY AND JUSTIFICATION FOR PROCEDURE:  She had a prior knee arthroscopy by one of my partners, Dr. Shelle Iron in November 2010 and felt that she had partial discoid medial meniscus, a partial medial meniscectomy was performed.  She reinjured her right knee moving a chicken coop on July 02, 2011.  MRI demonstrated preoperative diagnosis. Consequently, she is here for the above-mentioned surgery.  PROCEDURE IN DETAIL:  Satisfactory general anesthesia, Ace wrap and knee support to left lower extremity, pneumatic tourniquet applied to right lower extremity,  the leg Esmarched out nonsterilely, tourniquet inflated to 350 mmHg. Thigh stabilizer applied.  The right leg was then prepped with DuraPrep from stabilizer to ankle and draped in sterile field.  Time-out was performed.  Superior medial saline inflow.  First, through an anterolateral portal, the medial compartment of the knee joint was evaluated.  Immediately apparent was a significantly torn disrupted medial meniscus compatible with a discoid anatomy.  This was associated with grade 2 out of 4 chondromalacia medial femoral condyle, which I smoothed down with a 3.5 shaver.  I then performed partial medial  meniscectomy, shaving the meniscus back to a stable rim with a combination of baskets and a 3.5 shaver.  At the posterior curve, she had basically no meniscus remaining with resection back to the joint lining.  Looking at the medial gutter and suprapatellar area, there was patellar wear noted.  This was mainly in the lateral half.  I partially shaved it down through these portals, but had to complete the shaving with a reverse portal.  Final pictures were taken.  ACL was intact. Lateral compartment of the knee joint looked normal.  The knee joint was then irrigated until clear, all fluid possible removed and closed the joint portals with 4-0 nylon, and then injected 20 mL of 0.5% Marcaine with adrenaline, 4 mg morphine through the inflow apparatus, which I removed and this portal was closed with 4-0 nylon as well. Adaptic dry sterile dressing was applied.  Tourniquet was released.  She tolerated the procedure well, was taken to recovery in satisfactory condition with no complication.          ______________________________ Marlowe Kays, M.D.     JA/MEDQ  D:  09/05/2011  T:  09/05/2011  Job:  [id number]  Electronically Signed by Marlowe Kays M.D. on 09/16/2011 07:18:49 AM

## 2011-09-24 ENCOUNTER — Other Ambulatory Visit: Payer: Self-pay | Admitting: Family Medicine

## 2011-09-24 MED ORDER — NORGESTREL-ETHINYL ESTRADIOL 0.3-30 MG-MCG PO TABS: 1.0000 | ORAL_TABLET | ORAL | Status: DC

## 2011-09-24 NOTE — Telephone Encounter (Signed)
Faxed.   KP 

## 2011-10-01 ENCOUNTER — Encounter: Payer: Self-pay | Admitting: Family Medicine

## 2011-10-01 ENCOUNTER — Ambulatory Visit (INDEPENDENT_AMBULATORY_CARE_PROVIDER_SITE_OTHER): Payer: Managed Care, Other (non HMO) | Admitting: Family Medicine

## 2011-10-01 VITALS — BP 132/86 | HR 112 | Temp 98.7°F | Wt 242.0 lb

## 2011-10-01 DIAGNOSIS — J329 Chronic sinusitis, unspecified: Secondary | ICD-10-CM

## 2011-10-01 MED ORDER — FLUTICASONE PROPIONATE 50 MCG/ACT NA SUSP: NASAL | Status: DC

## 2011-10-01 MED ORDER — METHYLPREDNISOLONE ACETATE 80 MG/ML IJ SUSP
80.0000 mg | Freq: Once | INTRAMUSCULAR | Status: AC
  Administered 2011-10-01: 80 mg via INTRAMUSCULAR

## 2011-10-01 MED ORDER — PREDNISONE 10 MG PO TABS: ORAL_TABLET | ORAL | Status: DC

## 2011-10-01 MED ORDER — CLARITHROMYCIN ER 500 MG PO TB24: ORAL_TABLET | ORAL | Status: DC

## 2011-10-01 NOTE — Patient Instructions (Signed)

## 2011-10-01 NOTE — Progress Notes (Signed)
  Subjective:     Monica Perkins is a 30 y.o. female who presents for evaluation of sinus pain. Symptoms include: congestion, cough, facial pain, headaches, nasal congestion, sinus pressure and tooth pain. Onset of symptoms was 5 days ago. Symptoms have been gradually worsening since that time. Past history is significant for no history of pneumonia or bronchitis. Patient is a former smoker, quit 10/29/2010 years ago.  The following portions of the patient's history were reviewed and updated as appropriate: allergies, current medications, past family history, past medical history, past social history, past surgical history and problem list.  Review of Systems Pertinent items are noted in HPI.   Objective:    BP 132/86  Pulse 112  Temp(Src) 98.7 F (37.1 C) (Oral)  Wt 242 lb (109.77 kg)  SpO2 97% General appearance: alert, cooperative, appears stated age and mild distress Head: Normocephalic, without obvious abnormality, sinuses tender to percussion Ears: normal TM's and external ear canals both ears Nose: green discharge, severe congestion, turbinates red, swollen, sinus tenderness bilateral Throat: lips, mucosa, and tongue normal; teeth and gums normal Neck: mild anterior cervical adenopathy and thyroid not enlarged, symmetric, no tenderness/mass/nodules Lungs: clear to auscultation bilaterally Heart: regular rate and rhythm, S1, S2 normal, no murmur, click, rub or gallop Extremities: extremities normal, atraumatic, no cyanosis or edema    Assessment:    Acute bacterial sinusitis.    Plan:    Neti pot recommended. Instructions given. Nasal steroids per medication orders. Antihistamines per medication orders. Biaxin per medication orders. depo medrol, pred taper, flonase  rto prn

## 2011-10-22 ENCOUNTER — Other Ambulatory Visit: Payer: Self-pay | Admitting: Family Medicine

## 2011-10-23 ENCOUNTER — Telehealth: Payer: Self-pay

## 2011-10-23 ENCOUNTER — Other Ambulatory Visit: Payer: Self-pay | Admitting: Family Medicine

## 2011-10-23 DIAGNOSIS — IMO0001 Reserved for inherently not codable concepts without codable children: Secondary | ICD-10-CM

## 2011-10-23 MED ORDER — NORETHINDRONE ACET-ETHINYL EST 1.5-30 MG-MCG PO TABS: 1.0000 | ORAL_TABLET | Freq: Every day | ORAL | Status: DC

## 2011-10-23 NOTE — Telephone Encounter (Signed)
Call from pharmacy and the birth control is on Manufacturer back order. Please advise on alternative     KP

## 2011-10-23 NOTE — Telephone Encounter (Signed)
loestrin sent to pharmacy. 

## 2011-10-28 ENCOUNTER — Ambulatory Visit (INDEPENDENT_AMBULATORY_CARE_PROVIDER_SITE_OTHER): Payer: Managed Care, Other (non HMO) | Admitting: Family Medicine

## 2011-10-28 ENCOUNTER — Encounter: Payer: Self-pay | Admitting: Family Medicine

## 2011-10-28 VITALS — BP 128/88 | HR 96 | Temp 98.0°F | Ht 67.0 in | Wt 246.2 lb

## 2011-10-28 DIAGNOSIS — Z0000 Encounter for general adult medical examination without abnormal findings: Secondary | ICD-10-CM

## 2011-10-28 DIAGNOSIS — Z23 Encounter for immunization: Secondary | ICD-10-CM

## 2011-10-28 DIAGNOSIS — IMO0001 Reserved for inherently not codable concepts without codable children: Secondary | ICD-10-CM

## 2011-10-28 DIAGNOSIS — Z309 Encounter for contraceptive management, unspecified: Secondary | ICD-10-CM

## 2011-10-28 DIAGNOSIS — I10 Essential (primary) hypertension: Secondary | ICD-10-CM

## 2011-10-28 MED ORDER — DROSPIRENONE-ETHINYL ESTRADIOL 3-0.02 MG PO TABS: 1.0000 | ORAL_TABLET | Freq: Every day | ORAL | Status: DC

## 2011-10-28 MED ORDER — OLMESARTAN MEDOXOMIL-HCTZ 20-12.5 MG PO TABS: 1.0000 | ORAL_TABLET | Freq: Every day | ORAL | Status: DC

## 2011-10-28 NOTE — Patient Instructions (Signed)

## 2011-10-28 NOTE — Progress Notes (Signed)
  Subjective:     Monica Perkins is a 30 y.o. female and is here for a comprehensive physical exam. The patient reports no problems.  History   Social History  . Marital Status: Single    Spouse Name: N/A    Number of Children: N/A  . 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 Smoking status: Former Games developer  . Smokeless tobacco: Not on file  . Alcohol Use: No  . Drug Use: No  . Sexually Active: Not on file   Other Topics Concern  . Not on file   Social History Narrative   Single-life partner   Health Maintenance  Topic Date Due  . Influenza Vaccine  09/28/2012  . Tetanus/tdap  08/07/2014  . Pap Smear  10/27/2014    The following portions of the patient's history were reviewed and updated as appropriate: allergies, current medications, past family history, past medical history, past social history, past surgical history and problem list.  Review of Systems Review of Systems  Constitutional: Negative for activity change, appetite change and fatigue.  HENT: Negative for hearing loss, congestion, tinnitus and ear discharge.  dentist q49m Eyes: Negative for visual disturbance (see optho q1y -- vision corrected to 20/20 with glasses).  Respiratory: Negative for cough, chest tightness and shortness of breath.   Cardiovascular: Negative for chest pain, palpitations and leg swelling.  Gastrointestinal: Negative for abdominal pain, diarrhea, constipation and abdominal distention.  Genitourinary: Negative for urgency, frequency, decreased urine volume and difficulty urinating.  Musculoskeletal: Negative for back pain, arthralgias and gait problem.  Skin: Negative for color change, pallor and rash.  Neurological: Negative for dizziness, light-headedness, numbness and headaches.  Hematological: Negative for adenopathy. Does not bruise/bleed easily.  Psychiatric/Behavioral: Negative for suicidal ideas, confusion, sleep  disturbance, self-injury, dysphoric mood, decreased concentration and agitation.       Objective:    BP 128/88  Pulse 96  Temp(Src) 98 F (36.7 C) (Oral)  Ht 5\' 7"  (1.702 m)  Wt 246 lb 3.2 oz (111.676 kg)  BMI 38.56 kg/m2  SpO2 98% General appearance: alert, cooperative, appears stated age and no distress Head: Normocephalic, without obvious abnormality, atraumatic Eyes: conjunctivae/corneas clear. PERRL, EOM's intact. Fundi benign. Ears: normal TM's and external ear canals both ears Nose: Nares normal. Septum midline. Mucosa normal. No drainage or sinus tenderness. Throat: lips, mucosa, and tongue normal; teeth and gums normal Neck: no adenopathy, no carotid bruit, no JVD, supple, symmetrical, trachea midline and thyroid not enlarged, symmetric, no tenderness/mass/nodules Back: symmetric, no curvature. ROM normal. No CVA tenderness. Lungs: clear to auscultation bilaterally Breasts: normal appearance, no masses or tenderness Heart: regular rate and rhythm, S1, S2 normal, no murmur, click, rub or gallop Abdomen: soft, non-tender; bowel sounds normal; no masses,  no organomegaly Pelvic: not done--pt is monogamous,  never had abnormal pap Extremities: extremities normal, atraumatic, no cyanosis or edema Pulses: 2+ and symmetric Skin: Skin color, texture, turgor normal. No rashes or lesions Lymph nodes: Cervical, supraclavicular, and axillary nodes normal. Neurologic: Alert and oriented X 3, normal strength and tone. Normal symmetric reflexes. Normal coordination and gait psych-- no anxiety, no depression    Assessment:    Healthy female exam.  htn    Plan:    ghm utd Flu shot given Check fasting labs See After Visit Summary for Counseling Recommendations

## 2011-10-29 ENCOUNTER — Other Ambulatory Visit: Payer: Self-pay | Admitting: Family Medicine

## 2011-10-30 ENCOUNTER — Other Ambulatory Visit (INDEPENDENT_AMBULATORY_CARE_PROVIDER_SITE_OTHER): Payer: Managed Care, Other (non HMO)

## 2011-10-30 DIAGNOSIS — I10 Essential (primary) hypertension: Secondary | ICD-10-CM

## 2011-10-30 DIAGNOSIS — Z0000 Encounter for general adult medical examination without abnormal findings: Secondary | ICD-10-CM

## 2011-10-30 LAB — BASIC METABOLIC PANEL
CO2: 26 mEq/L (ref 19–32)
Calcium: 9.2 mg/dL (ref 8.4–10.5)
Chloride: 106 mEq/L (ref 96–112)
Creatinine, Ser: 0.7 mg/dL (ref 0.4–1.2)
Sodium: 142 mEq/L (ref 135–145)

## 2011-10-30 LAB — LIPID PANEL
HDL: 50.3 mg/dL (ref >39.00)
LDL Cholesterol: 106 mg/dL — ABNORMAL HIGH (ref 0–99)
Total CHOL/HDL Ratio: 4
Triglycerides: 115 mg/dL (ref 0.0–149.0)

## 2011-10-30 LAB — CBC WITH DIFFERENTIAL/PLATELET
Basophils Absolute: 0 10*3/uL (ref 0.0–0.1)
Eosinophils Absolute: 0.1 10*3/uL (ref 0.0–0.7)
Lymphocytes Relative: 24.6 % (ref 12.0–46.0)
MCHC: 34.6 g/dL (ref 30.0–36.0)
Monocytes Relative: 4.9 % (ref 3.0–12.0)
Neutrophils Relative %: 68.9 % (ref 43.0–77.0)
Platelets: 280 10*3/uL (ref 150.0–400.0)
RDW: 12.7 % (ref 11.5–14.6)

## 2011-10-30 LAB — HEPATIC FUNCTION PANEL
Alkaline Phosphatase: 53 U/L (ref 39–117)
Bilirubin, Direct: 0 mg/dL (ref 0.0–0.3)
Total Bilirubin: 0.6 mg/dL (ref 0.3–1.2)
Total Protein: 7.4 g/dL (ref 6.0–8.3)

## 2011-10-30 LAB — TSH: TSH: 1.99 u[IU]/mL (ref 0.35–5.50)

## 2011-10-30 NOTE — Progress Notes (Signed)
LABS ONLY  

## 2012-01-07 ENCOUNTER — Other Ambulatory Visit: Payer: Self-pay | Admitting: Family Medicine

## 2012-01-07 DIAGNOSIS — I10 Essential (primary) hypertension: Secondary | ICD-10-CM

## 2012-01-07 MED ORDER — OLMESARTAN MEDOXOMIL-HCTZ 20-12.5 MG PO TABS: 1.0000 | ORAL_TABLET | Freq: Every day | ORAL | Status: DC

## 2012-01-07 NOTE — Telephone Encounter (Signed)
Faxed.   KP 

## 2012-01-08 ENCOUNTER — Other Ambulatory Visit: Payer: Self-pay

## 2012-01-08 DIAGNOSIS — I10 Essential (primary) hypertension: Secondary | ICD-10-CM

## 2012-01-08 MED ORDER — OLMESARTAN MEDOXOMIL-HCTZ 20-12.5 MG PO TABS: 1.0000 | ORAL_TABLET | Freq: Every day | ORAL | Status: DC

## 2012-03-08 ENCOUNTER — Telehealth: Payer: Self-pay | Admitting: Family Medicine

## 2012-03-08 DIAGNOSIS — IMO0001 Reserved for inherently not codable concepts without codable children: Secondary | ICD-10-CM

## 2012-03-08 MED ORDER — DROSPIRENONE-ETHINYL ESTRADIOL 3-0.02 MG PO TABS: 1.0000 | ORAL_TABLET | Freq: Every day | ORAL | Status: DC

## 2012-03-08 NOTE — Telephone Encounter (Signed)
Refill: Gianvi 3 mg- 0.02mg  tablet. Request for 90 day supply

## 2012-03-08 NOTE — Telephone Encounter (Signed)
Rx faxed.    KP 

## 2012-07-14 ENCOUNTER — Ambulatory Visit (INDEPENDENT_AMBULATORY_CARE_PROVIDER_SITE_OTHER): Payer: Managed Care, Other (non HMO) | Admitting: Family Medicine

## 2012-07-14 ENCOUNTER — Encounter: Payer: Self-pay | Admitting: Family Medicine

## 2012-07-14 VITALS — BP 126/84 | HR 105 | Temp 98.5°F | Wt 248.2 lb

## 2012-07-14 DIAGNOSIS — R05 Cough: Secondary | ICD-10-CM

## 2012-07-14 DIAGNOSIS — J329 Chronic sinusitis, unspecified: Secondary | ICD-10-CM

## 2012-07-14 MED ORDER — CLARITHROMYCIN ER 500 MG PO TB24: 1000.0000 mg | ORAL_TABLET | Freq: Every day | ORAL | Status: AC

## 2012-07-14 MED ORDER — LEVOCETIRIZINE DIHYDROCHLORIDE 5 MG PO TABS: 5.0000 mg | ORAL_TABLET | Freq: Every evening | ORAL | Status: DC

## 2012-07-14 MED ORDER — GUAIFENESIN-CODEINE 100-10 MG/5ML PO SYRP: ORAL_SOLUTION | ORAL | Status: DC

## 2012-07-14 NOTE — Patient Instructions (Signed)

## 2012-07-14 NOTE — Progress Notes (Signed)
  Subjective:     Monica Perkins is a 31 y.o. female who presents for evaluation of sinus pain. Symptoms include: congestion, cough, facial pain, headaches, nasal congestion, post nasal drip, sinus pressure, sneezing and sore throat. Onset of symptoms was 4 days ago. Symptoms have been gradually worsening since that time. Past history is significant for no history of pneumonia or bronchitis. Patient is a former smoker --- quit  10/2010.  Pt has been taking claritin with no relief.   The following portions of the patient's history were reviewed and updated as appropriate: allergies, current medications, past family history, past medical history, past social history, past surgical history and problem list.  Review of Systems Pertinent items are noted in HPI.   Objective:    BP 126/84  Pulse 105  Temp 98.5 F (36.9 C) (Oral)  Wt 248 lb 3.2 oz (112.583 kg)  SpO2 97% General appearance: alert, cooperative, appears stated age and no distress Ears: R ear-- + fluid,  L ear -normal Nose: green discharge, moderate congestion, turbinates red, swollen, sinus tenderness bilateral Throat: lips, mucosa, and tongue normal; teeth and gums normal Neck: mild anterior cervical adenopathy, supple, symmetrical, trachea midline and thyroid not enlarged, symmetric, no tenderness/mass/nodules Lungs: clear to auscultation bilaterally Heart: S1, S2 normal    Assessment:    Acute bacterial sinusitis.    Plan:  Biaxin xl qnsal xyzal cheratussin for pm cough mucinex DM for day NO  "D" products

## 2012-08-13 ENCOUNTER — Encounter: Payer: Self-pay | Admitting: Family Medicine

## 2012-08-13 ENCOUNTER — Ambulatory Visit (INDEPENDENT_AMBULATORY_CARE_PROVIDER_SITE_OTHER): Payer: Managed Care, Other (non HMO) | Admitting: Family Medicine

## 2012-08-13 VITALS — BP 114/76 | HR 90 | Temp 98.3°F | Wt 249.6 lb

## 2012-08-13 DIAGNOSIS — L255 Unspecified contact dermatitis due to plants, except food: Secondary | ICD-10-CM

## 2012-08-13 DIAGNOSIS — L237 Allergic contact dermatitis due to plants, except food: Secondary | ICD-10-CM | POA: Insufficient documentation

## 2012-08-13 MED ORDER — METHYLPREDNISOLONE ACETATE 80 MG/ML IJ SUSP
80.0000 mg | Freq: Once | INTRAMUSCULAR | Status: AC
  Administered 2012-08-13: 80 mg via INTRAMUSCULAR

## 2012-08-13 MED ORDER — PREDNISONE 20 MG PO TABS: ORAL_TABLET | ORAL | Status: DC

## 2012-08-13 NOTE — Patient Instructions (Addendum)
Start the prednisone tomorrow morning Use benadryl as needed for itching Try gold bond, benadryl cream, hydrocortisone, etc as needed for topical itching Call with any questions or concerns Hang in there!!!

## 2012-08-13 NOTE — Progress Notes (Signed)
  Subjective:    Patient ID: Monica Perkins, female    DOB: 1981-09-05, 31 y.o.   MRN: [id number]  HPI Plant dermatitis- was doing yard work 10 days ago and scooping clippings into a pile w/ arms and rash erupted 2 days after.  Now has clusters of vesicles on both arms.  Very itchy.  + swelling.  Has tried 'everything' OTC w/out relief.   Review of Systems For ROS see HPI     Objective:   Physical Exam  Vitals reviewed. Constitutional: She appears well-developed and well-nourished. No distress.  Skin: Skin is warm and dry. Rash (multiple linear areas on arms bilaterally consistent w/ contact dermatitis) noted.          Assessment & Plan:

## 2012-08-22 NOTE — Assessment & Plan Note (Signed)
New.  Due to diffuse distribution and severity of sxs will start prednisone.  Reviewed supportive care and red flags that should prompt return.  Pt expressed understanding and is in agreement w/ plan.

## 2012-09-11 IMAGING — CR DG CHEST 2V
2 series · 2 of 2 positions shown · non-contrast
Comparison: PA and lateral chest 01/06/2005.

CLINICAL DATA: Cough.

CHEST - 2 VIEW

[view not recorded (1 of 2)]
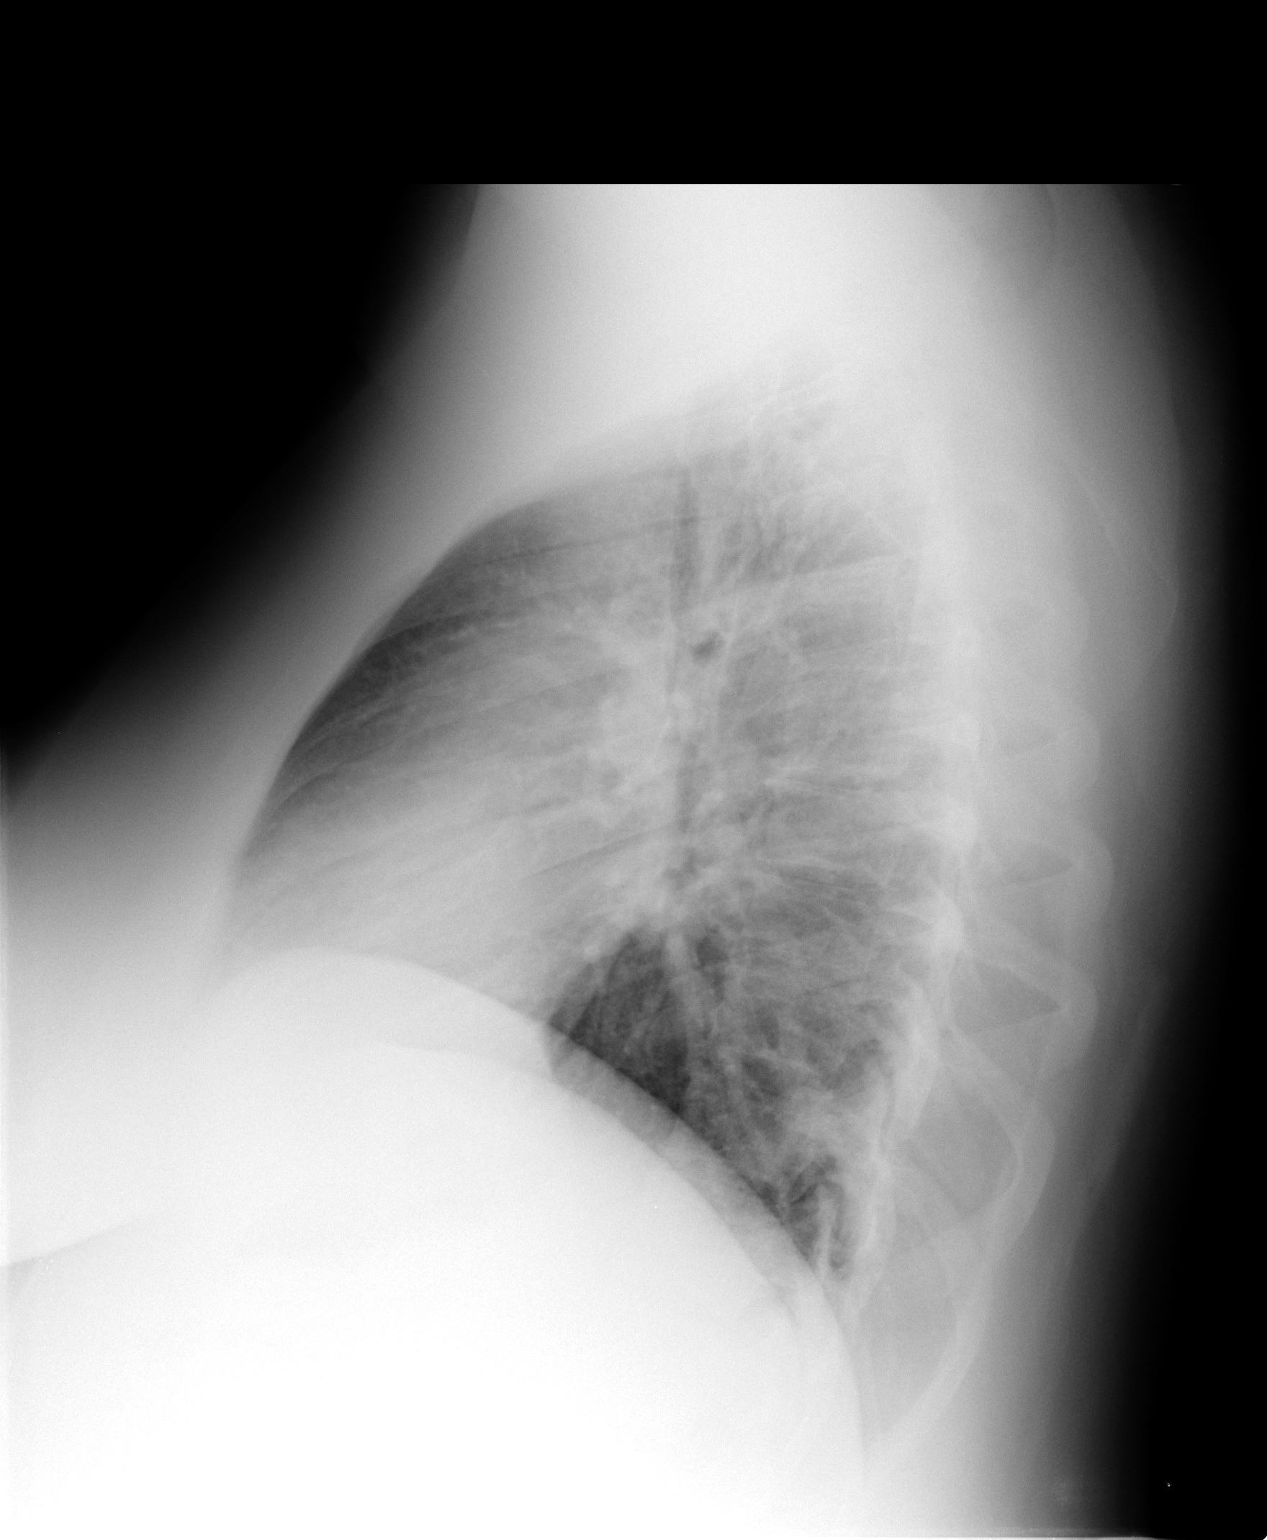

[view not recorded (2 of 2)]
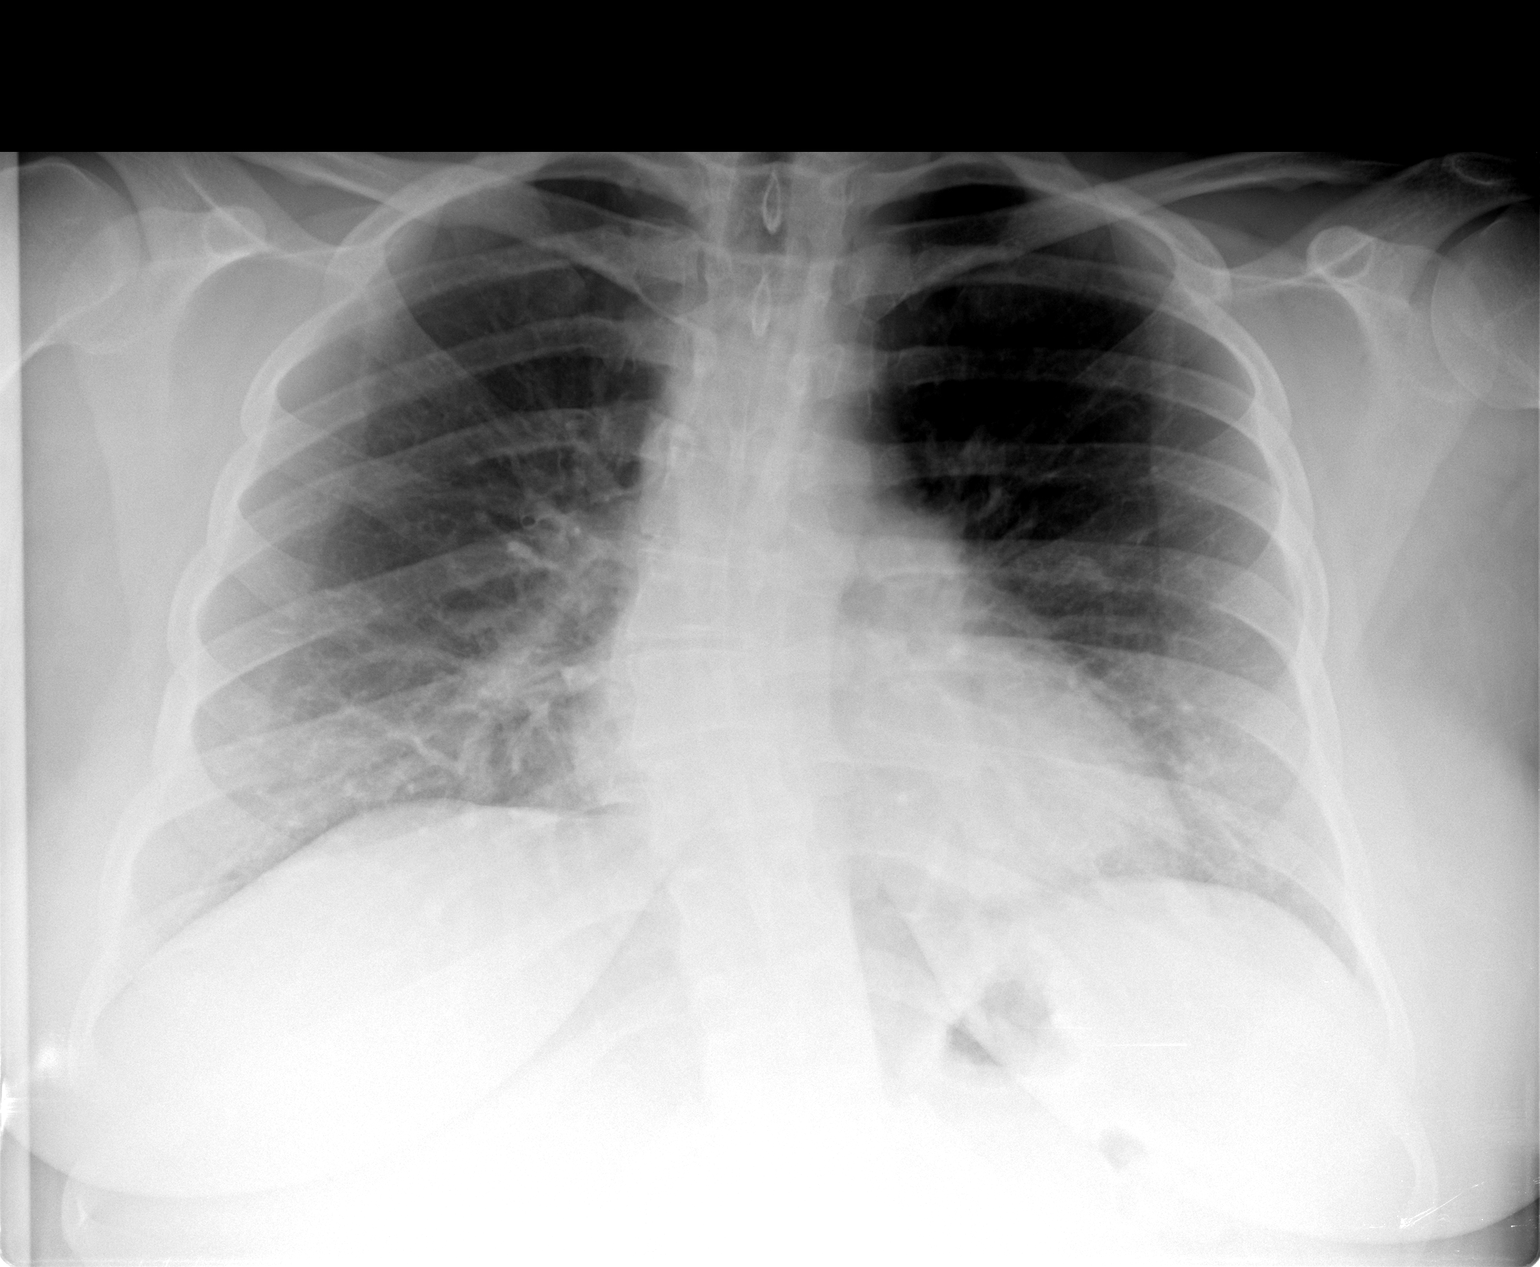

[2 of 2 positions shown; findings below may reference images not displayed]

FINDINGS: Lungs are clear.  Heart size normal.  No pneumothorax or
pleural effusion.  Convex right thoracic scoliosis noted.
IMPRESSION: No acute finding.  Stable compared to prior exam.

## 2012-11-03 ENCOUNTER — Encounter: Payer: Self-pay | Admitting: Internal Medicine

## 2012-11-03 ENCOUNTER — Ambulatory Visit (INDEPENDENT_AMBULATORY_CARE_PROVIDER_SITE_OTHER): Payer: Managed Care, Other (non HMO) | Admitting: Internal Medicine

## 2012-11-03 VITALS — BP 122/84 | HR 83 | Temp 98.7°F | Wt 250.0 lb

## 2012-11-03 DIAGNOSIS — J029 Acute pharyngitis, unspecified: Secondary | ICD-10-CM

## 2012-11-03 LAB — POCT RAPID STREP A (OFFICE): Rapid Strep A Screen: NEGATIVE

## 2012-11-03 MED ORDER — CLINDAMYCIN HCL 300 MG PO CAPS: 300.0000 mg | ORAL_CAPSULE | Freq: Three times a day (TID) | ORAL | Status: DC

## 2012-11-03 NOTE — Patient Instructions (Addendum)
Rest, fluids , tylenol , OTC Cepacol Take the antibiotic as prescribed  (clindamycin) Call if no better in few days Call anytime if the symptoms are severe, can't  swallow, drooling

## 2012-11-03 NOTE — Progress Notes (Signed)
  Subjective:    Patient ID: Monica Perkins, female    DOB: 08/08/1981, 31 y.o.   MRN: [id number]  HPI acute visit One day history of intense sore throat described as a raw feeling, worse when she swallows. Also some hoarse voice and mild ear ache.  Past Medical History  Diagnosis Date  . Arthritis   . Hypertension   . Migraines    Past Surgical History  Procedure Date  . Tonsillectomy   . Discoid miniscus   . Knee arthroscopy 10-2010    right   History   Social History  . Marital Status: Single    Spouse Name: N/A    Number of Children: N/A  . 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 Smoking status: Former Games developer  . Smokeless tobacco: Not on file  . Alcohol Use: No  . Drug Use: No  . Sexually Active: Not on file   Other Topics Concern  . Not on file   Social History Narrative   Single-life partner      Review of Systems No fever or chills No runny nose, sinus pain more than her regular allergies. No cough or chest congestion No nausea, vomiting, diarrhea. Next      Objective:   Physical Exam General -- alert, well-developed, and overweight appearing. No apparent distress.  HEENT --  TMs slt bulge, no red, canal normal; throat symmetric,moderately red R>L, no d/c, absent tonsils; face symmetric and not tender to palpation, nose not congested . No drooling Lungs -- normal respiratory effort, no intercostal retractions, no accessory muscle use, and normal breath sounds.   Heart-- normal rate, regular rhythm, no murmur, and no gallop.    Psych-- Cognition and judgment appear intact. Alert and cooperative with normal attention span and concentration.  not anxious appearing and not depressed appearing.       Assessment & Plan:  Acute pharyngitis, Rapid strep test negative. Plan: Start Cleocin empirically, send a throat culture, if negative will hold Cleocin. See instructions

## 2012-11-05 ENCOUNTER — Telehealth: Payer: Self-pay | Admitting: *Deleted

## 2012-11-05 MED ORDER — HYDROCODONE-HOMATROPINE 5-1.5 MG/5ML PO SYRP: 5.0000 mL | ORAL_SOLUTION | Freq: Three times a day (TID) | ORAL | Status: DC | PRN

## 2012-11-05 NOTE — Telephone Encounter (Signed)
Discussed with pt. Faxed rx.  

## 2012-11-05 NOTE — Telephone Encounter (Signed)
Recommend Mucinex DM twice a day, in addition I did a prescription for a hydrocodone syrup. Please fax it.  Advise patient medication may cause drowsiness, she may like to keep it for nighttime use only if she gets too sleepy

## 2012-11-06 LAB — CULTURE, GROUP A STREP

## 2012-11-09 ENCOUNTER — Telehealth: Payer: Self-pay | Admitting: Family Medicine

## 2012-11-09 NOTE — Telephone Encounter (Signed)
Patient left message on triage line requesting lab results. Please call back at [phone]

## 2012-11-09 NOTE — Telephone Encounter (Signed)
Discussed with pt

## 2012-11-17 ENCOUNTER — Ambulatory Visit (INDEPENDENT_AMBULATORY_CARE_PROVIDER_SITE_OTHER): Payer: Managed Care, Other (non HMO) | Admitting: Internal Medicine

## 2012-11-17 ENCOUNTER — Encounter: Payer: Self-pay | Admitting: Internal Medicine

## 2012-11-17 VITALS — BP 112/74 | HR 78 | Temp 98.0°F | Wt 253.0 lb

## 2012-11-17 DIAGNOSIS — M549 Dorsalgia, unspecified: Secondary | ICD-10-CM

## 2012-11-17 MED ORDER — CYCLOBENZAPRINE HCL 10 MG PO TABS: 10.0000 mg | ORAL_TABLET | Freq: Two times a day (BID) | ORAL | Status: DC | PRN

## 2012-11-17 MED ORDER — HYDROCODONE-ACETAMINOPHEN 5-500 MG PO TABS: 1.0000 | ORAL_TABLET | Freq: Three times a day (TID) | ORAL | Status: DC | PRN

## 2012-11-17 NOTE — Progress Notes (Signed)
  Subjective:    Patient ID: Monica Perkins, female    DOB: 1981-10-14, 31 y.o.   MRN: [id number]  HPI Acute visit Symptoms started about 4 days ago, she woke up with mid lower back pain, bilaterally. Pain is not getting better, denies any injury or heavy lifting. Pain is steady, nothing makes it better, increased when she walks, change position or if she stays in the same position for too long. No radiation although the proximal thighs get  numb from time to time  Past Medical History  Diagnosis Date  . Arthritis     knees  . Hypertension   . Migraines    Past Surgical History  Procedure Date  . Tonsillectomy   . Knee arthroscopy 10-2010    B knee surgeries, several   History   Social History  . Marital Status: Single    Spouse Name: N/A    Number of Children: 0  . 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 Smoking status: Former Games developer  . Smokeless tobacco: Never Used  . Alcohol Use: Yes     Comment: rarely   . Drug Use: No  . Sexually Active: Not on file   Other Topics Concern  . Not on file   Social History Narrative   Single-life partner     Review of Systems No fever or chills No bladder or bowel incontinence She had a strep throat a few days ago, ds/p antibiotics. Symptoms much improved.     Objective:   Physical Exam General -- alert, well-developed, and overweight appearing. No apparent distress.   Back: Not tender to palpation Extremities-- no pretibial edema bilaterally  Neurologic-- alert & oriented X3 ; position is slightly antalgic when she lays down in the table. Gait normal. DTRs symmetric except for the left knee jerk is a slightly decreased;  strength normal.. Psych-- Cognition and judgment appear intact. Alert and cooperative with normal attention span and concentration.  not anxious appearing and not depressed appearing.      Assessment & Plan:  Needs Benicar samples, 30  provided.

## 2012-11-17 NOTE — Assessment & Plan Note (Signed)
5 days history of mid back pain, neurological exam is essentially normal. She did have a documented strep infection few days ago. Plan: Conservative treatment, see instructions. Advise patient to call me if she has severe pain, fever chills or if she's not improving, given her recent strep infection, further workup may be warranted.

## 2012-11-17 NOTE — Patient Instructions (Addendum)
For pain: Ibuprofen (Motrin) 200 mg OTC 2 or 3 tabs every 8 hours as need for pain . This medicine can cause gastritis-ulcers in the stomach, if you develop nausea, stomach pain, change in the color of stools : stop ibuprofen and call us  Flexeril , a muscle relaxant twice a day, it too sleepy, take it only at night. Warm compress If the pain continue, okay to take Vicodin as needed. Will cause drowsiness. Call if the pain is severe, you have not improving in few days or if you have fevers.

## 2012-11-18 ENCOUNTER — Ambulatory Visit: Payer: Managed Care, Other (non HMO) | Admitting: Family Medicine

## 2013-01-19 ENCOUNTER — Other Ambulatory Visit: Payer: Self-pay | Admitting: Family Medicine

## 2013-01-19 DIAGNOSIS — IMO0001 Reserved for inherently not codable concepts without codable children: Secondary | ICD-10-CM

## 2013-01-19 MED ORDER — DROSPIRENONE-ETHINYL ESTRADIOL 3-0.02 MG PO TABS: ORAL_TABLET | ORAL | Status: DC

## 2013-01-19 NOTE — Telephone Encounter (Signed)
refill GIANVI 3 MG-0.02 MG Tablet #84wt/3-refills take one tablet by mouth daily last fill 11.3.13

## 2013-01-28 ENCOUNTER — Encounter: Payer: Self-pay | Admitting: Lab

## 2013-01-31 ENCOUNTER — Ambulatory Visit (INDEPENDENT_AMBULATORY_CARE_PROVIDER_SITE_OTHER): Payer: Managed Care, Other (non HMO) | Admitting: Family Medicine

## 2013-01-31 ENCOUNTER — Encounter: Payer: Self-pay | Admitting: Family Medicine

## 2013-01-31 VITALS — BP 130/76 | HR 101 | Temp 98.4°F | Ht 66.5 in | Wt 256.4 lb

## 2013-01-31 DIAGNOSIS — F419 Anxiety disorder, unspecified: Secondary | ICD-10-CM

## 2013-01-31 DIAGNOSIS — I10 Essential (primary) hypertension: Secondary | ICD-10-CM

## 2013-01-31 DIAGNOSIS — F411 Generalized anxiety disorder: Secondary | ICD-10-CM

## 2013-01-31 DIAGNOSIS — Z0000 Encounter for general adult medical examination without abnormal findings: Secondary | ICD-10-CM

## 2013-01-31 MED ORDER — ESCITALOPRAM OXALATE 10 MG PO TABS: 10.0000 mg | ORAL_TABLET | Freq: Every day | ORAL | Status: DC

## 2013-01-31 NOTE — Progress Notes (Signed)
  Subjective:    Patient here for follow-up of elevated blood pressure.  She is not exercising and is adherent to a low-salt diet.  Blood pressure is well controlled at home. Cardiac symptoms: none. Patient denies: chest pain, chest pressure/discomfort, claudication, dyspnea, exertional chest pressure/discomfort, fatigue, irregular heart beat, lower extremity edema, near-syncope, orthopnea, palpitations, paroxysmal nocturnal dyspnea, syncope and tachypnea. Cardiovascular risk factors: hypertension, obesity (BMI >= 30 kg/m2) and sedentary lifestyle. Use of agents associated with hypertension: none. History of target organ damage: none.Pt is also struggling with anxiety and worries about everything all the time.  The following portions of the patient's history were reviewed and updated as appropriate:  She  has a past medical history of Arthritis; Hypertension; and Migraines. She  does not have any pertinent problems on file. She  has past surgical history that includes Tonsillectomy and Knee arthroscopy (10-2010). Her family history includes Alcohol abuse in her maternal aunt; Arthritis in her father, maternal grandfather, maternal grandmother, and mother; Cancer in her father; Diabetes in her maternal aunt; Heart disease in her maternal grandfather and maternal grandmother; Hyperlipidemia in her maternal grandmother and mother; Hypertension in her maternal grandmother; Lung cancer in an unspecified family member; and Stroke in her father. She  reports that she has quit smoking. She has never used smokeless tobacco. She reports that she drinks alcohol. She reports that she does not use illicit drugs. She has a current medication list which includes the following prescription(s): drospirenone-ethinyl estradiol, loratadine-pseudoephedrine, olmesartan-hydrochlorothiazide, and escitalopram. Current Outpatient Prescriptions on File Prior to Visit  Medication Sig Dispense Refill  . drospirenone-ethinyl  estradiol (YAZ) 3-0.02 MG tablet 1 tab by mouth daily-Office visit due now  3 Package  0  . loratadine-pseudoephedrine (CLARITIN-D 12-HOUR) 5-120 MG per tablet Take 1 tablet by mouth 2 (two) times daily.      Marland Kitchen olmesartan-hydrochlorothiazide (BENICAR HCT) 20-12.5 MG per tablet Take 1 tablet by mouth daily.  90 tablet  3  . escitalopram (LEXAPRO) 10 MG tablet Take 1 tablet (10 mg total) by mouth daily.  30 tablet  2   She is allergic to cephalexin and penicillins..  Review of Systems Pertinent items are noted in HPI.     Objective:    BP 130/76  Pulse 101  Temp 98.4 F (36.9 C) (Oral)  Ht 5' 6.5" (1.689 m)  Wt 256 lb 6.4 oz (116.302 kg)  BMI 40.76 kg/m2  SpO2 96% General appearance: alert, cooperative and appears stated age Lungs: clear to auscultation bilaterally Heart: S1, S2 normal    Assessment:    Hypertension, normal blood pressure . Evidence of target organ damage: none.    Plan:    Medication: no change. Dietary sodium restriction. Regular aerobic exercise. Follow up: 6 months and as needed.

## 2013-01-31 NOTE — Patient Instructions (Signed)

## 2013-01-31 NOTE — Assessment & Plan Note (Signed)
lexapro  Xanax rto 1 month

## 2013-02-01 ENCOUNTER — Other Ambulatory Visit (INDEPENDENT_AMBULATORY_CARE_PROVIDER_SITE_OTHER): Payer: Managed Care, Other (non HMO)

## 2013-02-01 DIAGNOSIS — Z0000 Encounter for general adult medical examination without abnormal findings: Secondary | ICD-10-CM

## 2013-02-01 LAB — CBC WITH DIFFERENTIAL/PLATELET
Basophils Absolute: 0.1 10*3/uL (ref 0.0–0.1)
Eosinophils Absolute: 0.2 10*3/uL (ref 0.0–0.7)
HCT: 39.5 % (ref 36.0–46.0)
Lymphs Abs: 2.4 10*3/uL (ref 0.7–4.0)
MCHC: 34.5 g/dL (ref 30.0–36.0)
MCV: 90.5 fl (ref 78.0–100.0)
Monocytes Absolute: 0.5 10*3/uL (ref 0.1–1.0)
Monocytes Relative: 4.4 % (ref 3.0–12.0)
Platelets: 279 10*3/uL (ref 150.0–400.0)
RDW: 12.5 % (ref 11.5–14.6)

## 2013-02-01 LAB — HEPATIC FUNCTION PANEL
ALT: 109 U/L — ABNORMAL HIGH (ref 0–35)
AST: 89 U/L — ABNORMAL HIGH (ref 0–37)
Alkaline Phosphatase: 50 U/L (ref 39–117)
Bilirubin, Direct: 0 mg/dL (ref 0.0–0.3)
Total Bilirubin: 0.4 mg/dL (ref 0.3–1.2)

## 2013-02-01 LAB — LIPID PANEL: Total CHOL/HDL Ratio: 4

## 2013-02-01 LAB — BASIC METABOLIC PANEL
BUN: 11 mg/dL (ref 6–23)
CO2: 27 mEq/L (ref 19–32)
GFR: 106.53 mL/min (ref >60.00)
Glucose, Bld: 106 mg/dL — ABNORMAL HIGH (ref 70–99)
Potassium: 3.6 mEq/L (ref 3.5–5.1)

## 2013-02-01 LAB — TSH: TSH: 2.27 u[IU]/mL (ref 0.35–5.50)

## 2013-02-12 ENCOUNTER — Other Ambulatory Visit: Payer: Self-pay

## 2013-02-21 ENCOUNTER — Other Ambulatory Visit (INDEPENDENT_AMBULATORY_CARE_PROVIDER_SITE_OTHER): Payer: Managed Care, Other (non HMO)

## 2013-02-21 DIAGNOSIS — D7289 Other specified disorders of white blood cells: Secondary | ICD-10-CM

## 2013-02-21 LAB — CBC WITH DIFFERENTIAL/PLATELET
Basophils Absolute: 0.1 10*3/uL (ref 0.0–0.1)
Eosinophils Absolute: 0.2 10*3/uL (ref 0.0–0.7)
Hemoglobin: 13.3 g/dL (ref 12.0–15.0)
Lymphocytes Relative: 22 % (ref 12.0–46.0)
Lymphs Abs: 2.1 10*3/uL (ref 0.7–4.0)
MCHC: 34.5 g/dL (ref 30.0–36.0)
Neutro Abs: 6.8 10*3/uL (ref 1.4–7.7)
Platelets: 242 10*3/uL (ref 150.0–400.0)
RDW: 12.7 % (ref 11.5–14.6)

## 2013-02-21 LAB — HEPATIC FUNCTION PANEL
ALT: 94 U/L — ABNORMAL HIGH (ref 0–35)
AST: 68 U/L — ABNORMAL HIGH (ref 0–37)
Total Protein: 7 g/dL (ref 6.0–8.3)

## 2013-02-21 LAB — GAMMA GT: GGT: 62 U/L — ABNORMAL HIGH (ref 7–51)

## 2013-02-22 LAB — HEPATITIS PANEL, ACUTE
HCV Ab: NEGATIVE
Hep A IgM: NEGATIVE
Hepatitis B Surface Ag: NEGATIVE

## 2013-02-28 ENCOUNTER — Other Ambulatory Visit: Payer: Self-pay | Admitting: Family Medicine

## 2013-03-09 ENCOUNTER — Ambulatory Visit
Admission: RE | Admit: 2013-03-09 | Discharge: 2013-03-09 | Disposition: A | Discharge status: Home / Self Care | Payer: Managed Care, Other (non HMO) | Source: Ambulatory Visit | Attending: Family Medicine | Admitting: Family Medicine

## 2013-03-17 ENCOUNTER — Ambulatory Visit (INDEPENDENT_AMBULATORY_CARE_PROVIDER_SITE_OTHER): Payer: Managed Care, Other (non HMO) | Admitting: Family Medicine

## 2013-03-17 ENCOUNTER — Encounter: Payer: Self-pay | Admitting: Family Medicine

## 2013-03-17 ENCOUNTER — Other Ambulatory Visit (HOSPITAL_COMMUNITY)
Admission: RE | Admit: 2013-03-17 | Discharge: 2013-03-17 | Disposition: A | Discharge status: Home / Self Care | Payer: Managed Care, Other (non HMO) | Source: Ambulatory Visit | Attending: Family Medicine | Admitting: Family Medicine

## 2013-03-17 VITALS — BP 120/70 | HR 104 | Temp 98.5°F | Ht 67.0 in | Wt 259.0 lb

## 2013-03-17 DIAGNOSIS — K7689 Other specified diseases of liver: Secondary | ICD-10-CM

## 2013-03-17 DIAGNOSIS — I10 Essential (primary) hypertension: Secondary | ICD-10-CM

## 2013-03-17 DIAGNOSIS — F419 Anxiety disorder, unspecified: Secondary | ICD-10-CM

## 2013-03-17 DIAGNOSIS — R7309 Other abnormal glucose: Secondary | ICD-10-CM

## 2013-03-17 DIAGNOSIS — Z01419 Encounter for gynecological examination (general) (routine) without abnormal findings: Secondary | ICD-10-CM | POA: Insufficient documentation

## 2013-03-17 DIAGNOSIS — Z309 Encounter for contraceptive management, unspecified: Secondary | ICD-10-CM

## 2013-03-17 DIAGNOSIS — Z0000 Encounter for general adult medical examination without abnormal findings: Secondary | ICD-10-CM

## 2013-03-17 DIAGNOSIS — F411 Generalized anxiety disorder: Secondary | ICD-10-CM

## 2013-03-17 DIAGNOSIS — IMO0001 Reserved for inherently not codable concepts without codable children: Secondary | ICD-10-CM

## 2013-03-17 DIAGNOSIS — R739 Hyperglycemia, unspecified: Secondary | ICD-10-CM

## 2013-03-17 DIAGNOSIS — K760 Fatty (change of) liver, not elsewhere classified: Secondary | ICD-10-CM

## 2013-03-17 LAB — BASIC METABOLIC PANEL
BUN: 10 mg/dL (ref 6–23)
CO2: 25 mEq/L (ref 19–32)
Calcium: 9 mg/dL (ref 8.4–10.5)
Creatinine, Ser: 0.6 mg/dL (ref 0.4–1.2)
GFR: 130.07 mL/min (ref >60.00)
Glucose, Bld: 85 mg/dL (ref 70–99)
Sodium: 138 mEq/L (ref 135–145)

## 2013-03-17 LAB — HEPATIC FUNCTION PANEL
ALT: 171 U/L — ABNORMAL HIGH (ref 0–35)
AST: 165 U/L — ABNORMAL HIGH (ref 0–37)
Alkaline Phosphatase: 56 U/L (ref 39–117)
Bilirubin, Direct: 0.1 mg/dL (ref 0.0–0.3)
Total Bilirubin: 0.6 mg/dL (ref 0.3–1.2)
Total Protein: 7.4 g/dL (ref 6.0–8.3)

## 2013-03-17 MED ORDER — OLMESARTAN MEDOXOMIL-HCTZ 20-12.5 MG PO TABS: 1.0000 | ORAL_TABLET | Freq: Every day | ORAL | Status: DC

## 2013-03-17 MED ORDER — DROSPIRENONE-ETHINYL ESTRADIOL 3-0.02 MG PO TABS: ORAL_TABLET | ORAL | Status: DC

## 2013-03-17 MED ORDER — ESCITALOPRAM OXALATE 10 MG PO TABS: 20.0000 mg | ORAL_TABLET | Freq: Every day | ORAL | Status: DC

## 2013-03-17 NOTE — Progress Notes (Signed)
Subjective:     Monica Perkins is a 32 y.o. female and is here for a comprehensive physical exam. The patient reports no problems.  History   Social History  . Marital Status: Single    Spouse Name: N/A    Number of Children: 0  . 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 Smoking status: Former Games developer  . Smokeless tobacco: Never Used  . Alcohol Use: Yes     Comment: rarely   . Drug Use: No  . Sexually Active: Not on file   Other Topics Concern  . Not on file   Social History Narrative   Single-life partner   Health Maintenance  Topic Date Due  . Influenza Vaccine  08/29/2012  . Tetanus/tdap  08/07/2014  . Pap Smear  03/17/2016    The following portions of the patient's history were reviewed and updated as appropriate:  She  has a past medical history of Arthritis; Hypertension; and Migraines. She  does not have any pertinent problems on file. She  has past surgical history that includes Tonsillectomy and Knee arthroscopy (10-2010). Her family history includes Alcohol abuse in her maternal aunt; Arthritis in her father, maternal grandfather, maternal grandmother, and mother; Cancer in her father; Diabetes in her maternal aunt; Heart disease in her maternal grandfather and maternal grandmother; Hyperlipidemia in her maternal grandmother and mother; Hypertension in her maternal grandmother; Lung cancer in an unspecified family member; and Stroke in her father. She  reports that she has quit smoking. She has never used smokeless tobacco. She reports that  drinks alcohol. She reports that she does not use illicit drugs. She has a current medication list which includes the following prescription(s): cyanocobalamin, drospirenone-ethinyl estradiol, escitalopram, NON FORMULARY, olmesartan-hydrochlorothiazide, and pediatric multiple vit-c-fa. No current outpatient prescriptions on file prior to visit.   No current  facility-administered medications on file prior to visit.   She is allergic to cephalexin and penicillins..  Review of Systems Review of Systems  Constitutional: Negative for activity change, appetite change and fatigue.  HENT: Negative for hearing loss, congestion, tinnitus and ear discharge.  dentist q26m Eyes: Negative for visual disturbance (see optho q1y -- vision corrected to 20/20 with glasses).  Respiratory: Negative for cough, chest tightness and shortness of breath.   Cardiovascular: Negative for chest pain, palpitations and leg swelling.  Gastrointestinal: Negative for abdominal pain, diarrhea, constipation and abdominal distention.  Genitourinary: Negative for urgency, frequency, decreased urine volume and difficulty urinating.  Musculoskeletal: Negative for back pain, arthralgias and gait problem.  Skin: Negative for color change, pallor and rash.  Neurological: Negative for dizziness, light-headedness, numbness and headaches.  Hematological: Negative for adenopathy. Does not bruise/bleed easily.  Psychiatric/Behavioral: Negative for suicidal ideas, confusion, sleep disturbance, self-injury, dysphoric mood, decreased concentration and agitation.       Objective:    BP 120/70  Pulse 104  Temp(Src) 98.5 F (36.9 C) (Oral)  Ht 5\' 7"  (1.702 m)  Wt 259 lb (117.482 kg)  BMI 40.56 kg/m2  SpO2 98% General appearance: alert, cooperative, appears stated age and no distress Head: Normocephalic, without obvious abnormality, atraumatic Eyes: conjunctivae/corneas clear. PERRL, EOM's intact. Fundi benign. Ears: normal TM's and external ear canals both ears Nose: Nares normal. Septum midline. Mucosa normal. No drainage or sinus tenderness. Throat: lips, mucosa, and tongue normal; teeth and gums normal Neck: no adenopathy, supple, symmetrical, trachea midline and thyroid not enlarged, symmetric, no  tenderness/mass/nodules Back: symmetric, no curvature. ROM normal. No CVA  tenderness. Lungs: clear to auscultation bilaterally Breasts: normal appearance, no masses or tenderness Heart: regular rate and rhythm, S1, S2 normal, no murmur, click, rub or gallop Abdomen: soft, non-tender; bowel sounds normal; no masses,  no organomegaly Pelvic: cervix normal in appearance, external genitalia normal, no adnexal masses or tenderness, no cervical motion tenderness, rectovaginal septum normal, uterus normal size, shape, and consistency and vagina normal without discharge-pap done Extremities: extremities normal, atraumatic, no cyanosis or edema Pulses: 2+ and symmetric Skin: Skin color, texture, turgor normal. No rashes or lesions Lymph nodes: Cervical, supraclavicular, and axillary nodes normal. Neurologic: Alert and oriented X 3, normal strength and tone. Normal symmetric reflexes. Normal coordination and gait Psych-- no anxiety, no depression      Assessment:    Healthy female exam.       Plan:  ghm utd Labs reviewed---recheck liver   See After Visit Summary for Counseling Recommendations

## 2013-03-17 NOTE — Addendum Note (Signed)
Addended by: Lelon Perla on: 03/17/2013 02:30 PM   Modules accepted: Orders

## 2013-03-17 NOTE — Patient Instructions (Signed)
Preventive Care for Adults, Female A healthy lifestyle and preventive care can promote health and wellness. Preventive health guidelines for women include the following key practices.  A routine yearly physical is a good way to check with your caregiver about your health and preventive screening. It is a chance to share any concerns and updates on your health, and to receive a thorough exam.  Visit your dentist for a routine exam and preventive care every 6 months. Brush your teeth twice a day and floss once a day. Good oral hygiene prevents tooth decay and gum disease.  The frequency of eye exams is based on your age, health, family medical history, use of contact lenses, and other factors. Follow your caregiver's recommendations for frequency of eye exams.  Eat a healthy diet. Foods like vegetables, fruits, whole grains, low-fat dairy products, and lean protein foods contain the nutrients you need without too many calories. Decrease your intake of foods high in solid fats, added sugars, and salt. Eat the right amount of calories for you.Get information about a proper diet from your caregiver, if necessary.  Regular physical exercise is one of the most important things you can do for your health. Most adults should get at least 150 minutes of moderate-intensity exercise (any activity that increases your heart rate and causes you to sweat) each week. In addition, most adults need muscle-strengthening exercises on 2 or more days a week.  Maintain a healthy weight. The body mass index (BMI) is a screening tool to identify possible weight problems. It provides an estimate of body fat based on height and weight. Your caregiver can help determine your BMI, and can help you achieve or maintain a healthy weight.For adults 20 years and older:  A BMI below 18.5 is considered underweight.  A BMI of 18.5 to 24.9 is normal.  A BMI of 25 to 29.9 is considered overweight.  A BMI of 30 and above is  considered obese.  Maintain normal blood lipids and cholesterol levels by exercising and minimizing your intake of saturated fat. Eat a balanced diet with plenty of fruit and vegetables. Blood tests for lipids and cholesterol should begin at age 20 and be repeated every 5 years. If your lipid or cholesterol levels are high, you are over 50, or you are at high risk for heart disease, you may need your cholesterol levels checked more frequently.Ongoing high lipid and cholesterol levels should be treated with medicines if diet and exercise are not effective.  If you smoke, find out from your caregiver how to quit. If you do not use tobacco, do not start.  If you are pregnant, do not drink alcohol. If you are breastfeeding, be very cautious about drinking alcohol. If you are not pregnant and choose to drink alcohol, do not exceed 1 drink per day. One drink is considered to be 12 ounces (355 mL) of beer, 5 ounces (148 mL) of wine, or 1.5 ounces (44 mL) of liquor.  Avoid use of street drugs. Do not share needles with anyone. Ask for help if you need support or instructions about stopping the use of drugs.  High blood pressure causes heart disease and increases the risk of stroke. Your blood pressure should be checked at least every 1 to 2 years. Ongoing high blood pressure should be treated with medicines if weight loss and exercise are not effective.  If you are 55 to 30 years old, ask your caregiver if you should take aspirin to prevent strokes.  Diabetes   screening involves taking a blood sample to check your fasting blood sugar level. This should be done once every 3 years, after age 45, if you are within normal weight and without risk factors for diabetes. Testing should be considered at a younger age or be carried out more frequently if you are overweight and have at least 1 risk factor for diabetes.  Breast cancer screening is essential preventive care for women. You should practice "breast  self-awareness." This means understanding the normal appearance and feel of your breasts and may include breast self-examination. Any changes detected, no matter how small, should be reported to a caregiver. Women in their 20s and 30s should have a clinical breast exam (CBE) by a caregiver as part of a regular health exam every 1 to 3 years. After age 40, women should have a CBE every year. Starting at age 40, women should consider having a mammography (breast X-ray test) every year. Women who have a family history of breast cancer should talk to their caregiver about genetic screening. Women at a high risk of breast cancer should talk to their caregivers about having magnetic resonance imaging (MRI) and a mammography every year.  The Pap test is a screening test for cervical cancer. A Pap test can show cell changes on the cervix that might become cervical cancer if left untreated. A Pap test is a procedure in which cells are obtained and examined from the lower end of the uterus (cervix).  Women should have a Pap test starting at age 21.  Between ages 21 and 29, Pap tests should be repeated every 2 years.  Beginning at age 30, you should have a Pap test every 3 years as long as the past 3 Pap tests have been normal.  Some women have medical problems that increase the chance of getting cervical cancer. Talk to your caregiver about these problems. It is especially important to talk to your caregiver if a new problem develops soon after your last Pap test. In these cases, your caregiver may recommend more frequent screening and Pap tests.  The above recommendations are the same for women who have or have not gotten the vaccine for human papillomavirus (HPV).  If you had a hysterectomy for a problem that was not cancer or a condition that could lead to cancer, then you no longer need Pap tests. Even if you no longer need a Pap test, a regular exam is a good idea to make sure no other problems are  starting.  If you are between ages 65 and 70, and you have had normal Pap tests going back 10 years, you no longer need Pap tests. Even if you no longer need a Pap test, a regular exam is a good idea to make sure no other problems are starting.  If you have had past treatment for cervical cancer or a condition that could lead to cancer, you need Pap tests and screening for cancer for at least 20 years after your treatment.  If Pap tests have been discontinued, risk factors (such as a new sexual partner) need to be reassessed to determine if screening should be resumed.  The HPV test is an additional test that may be used for cervical cancer screening. The HPV test looks for the virus that can cause the cell changes on the cervix. The cells collected during the Pap test can be tested for HPV. The HPV test could be used to screen women aged 30 years and older, and should   be used in women of any age who have unclear Pap test results. After the age of 30, women should have HPV testing at the same frequency as a Pap test.  Colorectal cancer can be detected and often prevented. Most routine colorectal cancer screening begins at the age of 50 and continues through age 75. However, your caregiver may recommend screening at an earlier age if you have risk factors for colon cancer. On a yearly basis, your caregiver may provide home test kits to check for hidden blood in the stool. Use of a small camera at the end of a tube, to directly examine the colon (sigmoidoscopy or colonoscopy), can detect the earliest forms of colorectal cancer. Talk to your caregiver about this at age 50, when routine screening begins. Direct examination of the colon should be repeated every 5 to 10 years through age 75, unless early forms of pre-cancerous polyps or small growths are found.  Hepatitis C blood testing is recommended for all people born from 1945 through 1965 and any individual with known risks for hepatitis C.  Practice  safe sex. Use condoms and avoid high-risk sexual practices to reduce the spread of sexually transmitted infections (STIs). STIs include gonorrhea, chlamydia, syphilis, trichomonas, herpes, HPV, and human immunodeficiency virus (HIV). Herpes, HIV, and HPV are viral illnesses that have no cure. They can result in disability, cancer, and death. Sexually active women aged 25 and younger should be checked for chlamydia. Older women with new or multiple partners should also be tested for chlamydia. Testing for other STIs is recommended if you are sexually active and at increased risk.  Osteoporosis is a disease in which the bones lose minerals and strength with aging. This can result in serious bone fractures. The risk of osteoporosis can be identified using a bone density scan. Women ages 65 and over and women at risk for fractures or osteoporosis should discuss screening with their caregivers. Ask your caregiver whether you should take a calcium supplement or vitamin D to reduce the rate of osteoporosis.  Menopause can be associated with physical symptoms and risks. Hormone replacement therapy is available to decrease symptoms and risks. You should talk to your caregiver about whether hormone replacement therapy is right for you.  Use sunscreen with sun protection factor (SPF) of 30 or more. Apply sunscreen liberally and repeatedly throughout the day. You should seek shade when your shadow is shorter than you. Protect yourself by wearing long sleeves, pants, a wide-brimmed hat, and sunglasses year round, whenever you are outdoors.  Once a month, do a whole body skin exam, using a mirror to look at the skin on your back. Notify your caregiver of new moles, moles that have irregular borders, moles that are larger than a pencil eraser, or moles that have changed in shape or color.  Stay current with required immunizations.  Influenza. You need a dose every fall (or winter). The composition of the flu vaccine  changes each year, so being vaccinated once is not enough.  Pneumococcal polysaccharide. You need 1 to 2 doses if you smoke cigarettes or if you have certain chronic medical conditions. You need 1 dose at age 65 (or older) if you have never been vaccinated.  Tetanus, diphtheria, pertussis (Tdap, Td). Get 1 dose of Tdap vaccine if you are younger than age 65, are over 65 and have contact with an infant, are a healthcare worker, are pregnant, or simply want to be protected from whooping cough. After that, you need a Td   booster dose every 10 years. Consult your caregiver if you have not had at least 3 tetanus and diphtheria-containing shots sometime in your life or have a deep or dirty wound.  HPV. You need this vaccine if you are a woman age 26 or younger. The vaccine is given in 3 doses over 6 months.  Measles, mumps, rubella (MMR). You need at least 1 dose of MMR if you were born in 1957 or later. You may also need a second dose.  Meningococcal. If you are age 19 to 21 and a first-year college student living in a residence hall, or have one of several medical conditions, you need to get vaccinated against meningococcal disease. You may also need additional booster doses.  Zoster (shingles). If you are age 60 or older, you should get this vaccine.  Varicella (chickenpox). If you have never had chickenpox or you were vaccinated but received only 1 dose, talk to your caregiver to find out if you need this vaccine.  Hepatitis A. You need this vaccine if you have a specific risk factor for hepatitis A virus infection or you simply wish to be protected from this disease. The vaccine is usually given as 2 doses, 6 to 18 months apart.  Hepatitis B. You need this vaccine if you have a specific risk factor for hepatitis B virus infection or you simply wish to be protected from this disease. The vaccine is given in 3 doses, usually over 6 months. Preventive Services / Frequency Ages 19 to 39  Blood  pressure check.** / Every 1 to 2 years.  Lipid and cholesterol check.** / Every 5 years beginning at age 20.  Clinical breast exam.** / Every 3 years for women in their 20s and 30s.  Pap test.** / Every 2 years from ages 21 through 29. Every 3 years starting at age 30 through age 65 or 70 with a history of 3 consecutive normal Pap tests.  HPV screening.** / Every 3 years from ages 30 through ages 65 to 70 with a history of 3 consecutive normal Pap tests.  Hepatitis C blood test.** / For any individual with known risks for hepatitis C.  Skin self-exam. / Monthly.  Influenza immunization.** / Every year.  Pneumococcal polysaccharide immunization.** / 1 to 2 doses if you smoke cigarettes or if you have certain chronic medical conditions.  Tetanus, diphtheria, pertussis (Tdap, Td) immunization. / A one-time dose of Tdap vaccine. After that, you need a Td booster dose every 10 years.  HPV immunization. / 3 doses over 6 months, if you are 26 and younger.  Measles, mumps, rubella (MMR) immunization. / You need at least 1 dose of MMR if you were born in 1957 or later. You may also need a second dose.  Meningococcal immunization. / 1 dose if you are age 19 to 21 and a first-year college student living in a residence hall, or have one of several medical conditions, you need to get vaccinated against meningococcal disease. You may also need additional booster doses.  Varicella immunization.** / Consult your caregiver.  Hepatitis A immunization.** / Consult your caregiver. 2 doses, 6 to 18 months apart.  Hepatitis B immunization.** / Consult your caregiver. 3 doses usually over 6 months. Ages 40 to 64  Blood pressure check.** / Every 1 to 2 years.  Lipid and cholesterol check.** / Every 5 years beginning at age 20.  Clinical breast exam.** / Every year after age 40.  Mammogram.** / Every year beginning at age 40   and continuing for as long as you are in good health. Consult with your  caregiver.  Pap test.** / Every 3 years starting at age 30 through age 65 or 70 with a history of 3 consecutive normal Pap tests.  HPV screening.** / Every 3 years from ages 30 through ages 65 to 70 with a history of 3 consecutive normal Pap tests.  Fecal occult blood test (FOBT) of stool. / Every year beginning at age 50 and continuing until age 75. You may not need to do this test if you get a colonoscopy every 10 years.  Flexible sigmoidoscopy or colonoscopy.** / Every 5 years for a flexible sigmoidoscopy or every 10 years for a colonoscopy beginning at age 50 and continuing until age 75.  Hepatitis C blood test.** / For all people born from 1945 through 1965 and any individual with known risks for hepatitis C.  Skin self-exam. / Monthly.  Influenza immunization.** / Every year.  Pneumococcal polysaccharide immunization.** / 1 to 2 doses if you smoke cigarettes or if you have certain chronic medical conditions.  Tetanus, diphtheria, pertussis (Tdap, Td) immunization.** / A one-time dose of Tdap vaccine. After that, you need a Td booster dose every 10 years.  Measles, mumps, rubella (MMR) immunization. / You need at least 1 dose of MMR if you were born in 1957 or later. You may also need a second dose.  Varicella immunization.** / Consult your caregiver.  Meningococcal immunization.** / Consult your caregiver.  Hepatitis A immunization.** / Consult your caregiver. 2 doses, 6 to 18 months apart.  Hepatitis B immunization.** / Consult your caregiver. 3 doses, usually over 6 months. Ages 65 and over  Blood pressure check.** / Every 1 to 2 years.  Lipid and cholesterol check.** / Every 5 years beginning at age 20.  Clinical breast exam.** / Every year after age 40.  Mammogram.** / Every year beginning at age 40 and continuing for as long as you are in good health. Consult with your caregiver.  Pap test.** / Every 3 years starting at age 30 through age 65 or 70 with a 3  consecutive normal Pap tests. Testing can be stopped between 65 and 70 with 3 consecutive normal Pap tests and no abnormal Pap or HPV tests in the past 10 years.  HPV screening.** / Every 3 years from ages 30 through ages 65 or 70 with a history of 3 consecutive normal Pap tests. Testing can be stopped between 65 and 70 with 3 consecutive normal Pap tests and no abnormal Pap or HPV tests in the past 10 years.  Fecal occult blood test (FOBT) of stool. / Every year beginning at age 50 and continuing until age 75. You may not need to do this test if you get a colonoscopy every 10 years.  Flexible sigmoidoscopy or colonoscopy.** / Every 5 years for a flexible sigmoidoscopy or every 10 years for a colonoscopy beginning at age 50 and continuing until age 75.  Hepatitis C blood test.** / For all people born from 1945 through 1965 and any individual with known risks for hepatitis C.  Osteoporosis screening.** / A one-time screening for women ages 65 and over and women at risk for fractures or osteoporosis.  Skin self-exam. / Monthly.  Influenza immunization.** / Every year.  Pneumococcal polysaccharide immunization.** / 1 dose at age 65 (or older) if you have never been vaccinated.  Tetanus, diphtheria, pertussis (Tdap, Td) immunization. / A one-time dose of Tdap vaccine if you are over   65 and have contact with an infant, are a healthcare worker, or simply want to be protected from whooping cough. After that, you need a Td booster dose every 10 years.  Varicella immunization.** / Consult your caregiver.  Meningococcal immunization.** / Consult your caregiver.  Hepatitis A immunization.** / Consult your caregiver. 2 doses, 6 to 18 months apart.  Hepatitis B immunization.** / Check with your caregiver. 3 doses, usually over 6 months. ** Family history and personal history of risk and conditions may change your caregiver's recommendations. Document Released: 02/10/2002 Document Revised: 03/08/2012  Document Reviewed: 05/12/2011 [employer] Patient Information 2013 [employer].  

## 2013-03-17 NOTE — Assessment & Plan Note (Signed)
Stable---refill med

## 2013-03-17 NOTE — Assessment & Plan Note (Signed)
Doing ok but pt requesting to increase lexapro.---inc to 20 mg 0--- if still struggling to sleep--consider sleep eval

## 2013-03-17 NOTE — Addendum Note (Signed)
Addended by: Candie Echevaria L on: 03/17/2013 02:55 PM   Modules accepted: Orders

## 2013-03-18 ENCOUNTER — Other Ambulatory Visit: Payer: Self-pay

## 2013-03-21 ENCOUNTER — Encounter: Payer: Self-pay | Admitting: Gastroenterology

## 2013-03-22 ENCOUNTER — Telehealth: Payer: Self-pay | Admitting: *Deleted

## 2013-03-22 ENCOUNTER — Encounter: Payer: Self-pay | Admitting: *Deleted

## 2013-03-22 NOTE — Telephone Encounter (Signed)
Spoke with pt advised of test results and transferred to scheduler to make appt for varicella vaccine.

## 2013-03-24 ENCOUNTER — Ambulatory Visit (INDEPENDENT_AMBULATORY_CARE_PROVIDER_SITE_OTHER): Payer: Managed Care, Other (non HMO) | Admitting: *Deleted

## 2013-03-24 DIAGNOSIS — Z23 Encounter for immunization: Secondary | ICD-10-CM

## 2013-04-05 ENCOUNTER — Encounter: Payer: Self-pay | Admitting: *Deleted

## 2013-04-07 ENCOUNTER — Ambulatory Visit: Payer: Managed Care, Other (non HMO) | Admitting: Gastroenterology

## 2013-04-12 ENCOUNTER — Other Ambulatory Visit (INDEPENDENT_AMBULATORY_CARE_PROVIDER_SITE_OTHER): Payer: Managed Care, Other (non HMO)

## 2013-04-12 ENCOUNTER — Ambulatory Visit (INDEPENDENT_AMBULATORY_CARE_PROVIDER_SITE_OTHER): Payer: Managed Care, Other (non HMO) | Admitting: Gastroenterology

## 2013-04-12 ENCOUNTER — Encounter: Payer: Self-pay | Admitting: Gastroenterology

## 2013-04-12 VITALS — BP 120/78 | HR 92 | Ht 66.5 in | Wt 262.0 lb

## 2013-04-12 DIAGNOSIS — R748 Abnormal levels of other serum enzymes: Secondary | ICD-10-CM

## 2013-04-12 DIAGNOSIS — K219 Gastro-esophageal reflux disease without esophagitis: Secondary | ICD-10-CM

## 2013-04-12 DIAGNOSIS — K7689 Other specified diseases of liver: Secondary | ICD-10-CM

## 2013-04-12 DIAGNOSIS — K8020 Calculus of gallbladder without cholecystitis without obstruction: Secondary | ICD-10-CM

## 2013-04-12 DIAGNOSIS — K7581 Nonalcoholic steatohepatitis (NASH): Secondary | ICD-10-CM

## 2013-04-12 LAB — PROTIME-INR
INR: 1.1 ratio — ABNORMAL HIGH (ref 0.8–1.0)
Prothrombin Time: 11.5 s (ref 10.2–12.4)

## 2013-04-12 LAB — IBC PANEL
Saturation Ratios: 17.3 % — ABNORMAL LOW (ref 20.0–50.0)
Transferrin: 235.6 mg/dL (ref 212.0–360.0)

## 2013-04-12 NOTE — Patient Instructions (Addendum)
You have been scheduled for an liver ultrasound and biopsy at [facility] (1st floor of hospital) on 04/25/13 at 8 am. Please arrive 15 minutes prior to your appointment for registration. Make certain not to have anything to eat or drink after midnight  prior to your appointment. Should you need to reschedule your appointment, please contact radiology at [phone].  Your physician has requested that you go to the basement for lab work before leaving today. CC:  Loreen Freud MD

## 2013-04-12 NOTE — Progress Notes (Signed)
History of Present Illness:  This is a very pleasant 32 year old white Caucasian female who has had asymptomatic elevation of liver function tests with liver enzymes to 3 times normal for the last 2 years.  She's gained 70-80 pounds in weight over the last 8 years, has otherwise denies any history of liver disease, hepatitis, or family history of liver problems.  Her only symptom at this time his chronic fatigue, and she denies right upper quadrant pain, itching, skin rashes, joint pains, stomatitis, fever or chills.  Recent lab data has shown negative hepatitis serologies, ultrasound exam consistent with fatty infiltration of the liver in the solitary 3.2 cm. gallstone, a normal CBC.  She denies his symptoms of celiac disease although she is on Benicar 1 tablet daily and also birth control pills and Lexapro 10 mg a day for anxiety.  She denies a specific food intolerances, and does not exercise regularly.  She does complain of burning substernal chest pain and regurgitation 4-5 times a week without associated dysphagia or lower gastrointestinal symptoms.  I have reviewed this patient's present history, medical and surgical past history, allergies and medications.     ROS:   All systems were reviewed and are negative unless otherwise stated in the HPI.    Physical Exam: Blood pressure 120/78, pulse 92 and regular, and weight 262 pounds the BMI of 41.66.  I cannot appreciate stigmata of chronic liver disease although she does have mild palmar erythema. General well developed well nourished patient in no acute distress, appearing their stated age Eyes PERRLA, no icterus, fundoscopic exam per opthamologist Skin no lesions noted Neck supple, no adenopathy, no thyroid enlargement, no tenderness.. liver edge is palpable in the right upper quadrant in the overall size is mildly increased. Chest clear to percussion and auscultation Heart no significant murmurs, gallops or rubs noted Abdomen no  hepatosplenomegaly masses or tenderness, BS normal.  Extremities no acute joint lesions, edema, phlebitis or evidence of cellulitis. Neurologic patient oriented x 3, cranial nerves intact, no focal neurologic deficits noted. Psychological mental status normal and normal affect.  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  I will check further causes of metabolic liver disease with ANA, AMA, alfa-1 antitrypsin level, ceruloplasmin, prothrombin time, and iron levels.  I talked with her at length about her obesity and her probable need for bariatric surgery.  We will schedule ultrasound-guided liver biopsy for better evaluation of her liver disease, and perhaps this will  help to her to decide about bariatric surgical procedures.  The patient does not use alcohol, any over-the-counter medications, and has no real family history of liver disease.  Long discussion today concerning her obesity, Nash syndrome, and bariatric surgery.  I placed her on Prilosec 20 mg a day for acid reflux.  At the time of her possible future bariatric surgery, she will need laparoscopic cholecystectomy for asymptomatic gallstones.  Cc Dr. Susy Frizzle Martin,CCS and DR. Loreen Freud,,,,    Encounter Diagnosis  Name Primary?  . Elevated liver enzymes Yes

## 2013-04-13 ENCOUNTER — Other Ambulatory Visit: Payer: Managed Care, Other (non HMO)

## 2013-04-13 ENCOUNTER — Other Ambulatory Visit: Payer: Self-pay | Admitting: *Deleted

## 2013-04-13 DIAGNOSIS — R7989 Other specified abnormal findings of blood chemistry: Secondary | ICD-10-CM

## 2013-04-13 DIAGNOSIS — K7689 Other specified diseases of liver: Secondary | ICD-10-CM

## 2013-04-13 LAB — CERULOPLASMIN: Ceruloplasmin: 38 mg/dL (ref 20–60)

## 2013-04-13 LAB — ALPHA-1-ANTITRYPSIN: A-1 Antitrypsin, Ser: 163 mg/dL (ref 90–200)

## 2013-04-14 LAB — MITOCHONDRIAL ANTIBODIES: Mitochondrial M2 Ab, IgG: 0.29 (ref <0.91)

## 2013-04-15 LAB — HEMOCHROMATOSIS DNA-PCR(C282Y,H63D): DNA Mutation Analysis: NOT DETECTED

## 2013-04-18 ENCOUNTER — Encounter: Payer: Self-pay | Admitting: *Deleted

## 2013-04-21 ENCOUNTER — Other Ambulatory Visit: Payer: Self-pay | Admitting: Radiology

## 2013-04-21 ENCOUNTER — Ambulatory Visit (INDEPENDENT_AMBULATORY_CARE_PROVIDER_SITE_OTHER): Payer: Managed Care, Other (non HMO) | Admitting: *Deleted

## 2013-04-21 DIAGNOSIS — Z23 Encounter for immunization: Secondary | ICD-10-CM

## 2013-04-22 ENCOUNTER — Other Ambulatory Visit: Payer: Self-pay | Admitting: Radiology

## 2013-04-25 ENCOUNTER — Inpatient Hospital Stay (HOSPITAL_COMMUNITY): Admission: RE | Admit: 2013-04-25 | Payer: Managed Care, Other (non HMO) | Source: Ambulatory Visit

## 2013-04-26 ENCOUNTER — Encounter (HOSPITAL_COMMUNITY): Payer: Self-pay

## 2013-04-26 ENCOUNTER — Ambulatory Visit (HOSPITAL_COMMUNITY)
Admission: RE | Admit: 2013-04-26 | Discharge: 2013-04-26 | Disposition: A | Discharge status: Home / Self Care | Payer: Managed Care, Other (non HMO) | Source: Ambulatory Visit | Attending: Gastroenterology | Admitting: Gastroenterology

## 2013-04-26 DIAGNOSIS — R748 Abnormal levels of other serum enzymes: Secondary | ICD-10-CM | POA: Insufficient documentation

## 2013-04-26 DIAGNOSIS — K7689 Other specified diseases of liver: Secondary | ICD-10-CM | POA: Insufficient documentation

## 2013-04-26 DIAGNOSIS — I10 Essential (primary) hypertension: Secondary | ICD-10-CM | POA: Insufficient documentation

## 2013-04-26 LAB — CBC
Hemoglobin: 13.8 g/dL (ref 12.0–15.0)
MCH: 31.4 pg (ref 26.0–34.0)
MCHC: 35.2 g/dL (ref 30.0–36.0)
Platelets: 255 10*3/uL (ref 150–400)
RDW: 12.1 % (ref 11.5–15.5)

## 2013-04-26 LAB — APTT: aPTT: 32 seconds (ref 24–37)

## 2013-04-26 LAB — PROTIME-INR
INR: 0.98 (ref 0.00–1.49)
Prothrombin Time: 12.9 seconds (ref 11.6–15.2)

## 2013-04-26 MED ORDER — ONDANSETRON HCL 4 MG/2ML IJ SOLN
4.0000 mg | Freq: Once | INTRAMUSCULAR | Status: AC
  Administered 2013-04-26: 4 mg via INTRAVENOUS
  Filled 2013-04-26 (×2): qty 2

## 2013-04-26 MED ORDER — ONDANSETRON HCL 4 MG/2ML IJ SOLN
INTRAMUSCULAR | Status: AC
  Filled 2013-04-26: qty 2

## 2013-04-26 MED ORDER — SODIUM CHLORIDE 0.9 % IV SOLN
INTRAVENOUS | Status: DC
  Administered 2013-04-26: 10:00:00 via INTRAVENOUS

## 2013-04-26 MED ORDER — FENTANYL CITRATE 0.05 MG/ML IJ SOLN
INTRAMUSCULAR | Status: AC
  Filled 2013-04-26: qty 6

## 2013-04-26 MED ORDER — FENTANYL CITRATE 0.05 MG/ML IJ SOLN
INTRAMUSCULAR | Status: AC | PRN
  Administered 2013-04-26: 100 ug via INTRAVENOUS
  Administered 2013-04-26: 25 ug via INTRAVENOUS
  Administered 2013-04-26 (×2): 50 ug via INTRAVENOUS
  Administered 2013-04-26: 25 ug via INTRAVENOUS

## 2013-04-26 MED ORDER — ONDANSETRON HCL 4 MG/2ML IJ SOLN
4.0000 mg | Freq: Once | INTRAMUSCULAR | Status: AC
  Administered 2013-04-26: 4 mg via INTRAVENOUS

## 2013-04-26 MED ORDER — HYDROCODONE-ACETAMINOPHEN 5-325 MG PO TABS
1.0000 | ORAL_TABLET | ORAL | Status: DC | PRN
  Filled 2013-04-26: qty 2

## 2013-04-26 MED ORDER — MIDAZOLAM HCL 2 MG/2ML IJ SOLN
INTRAMUSCULAR | Status: AC
  Filled 2013-04-26: qty 6

## 2013-04-26 MED ORDER — MIDAZOLAM HCL 2 MG/2ML IJ SOLN
INTRAMUSCULAR | Status: AC | PRN
  Administered 2013-04-26: 1 mg via INTRAVENOUS
  Administered 2013-04-26: 0.5 mg via INTRAVENOUS
  Administered 2013-04-26 (×3): 1 mg via INTRAVENOUS
  Administered 2013-04-26: 0.5 mg via INTRAVENOUS

## 2013-04-26 NOTE — Procedures (Signed)
Successful US guided core liver biopsy X 2. No complications.

## 2013-04-26 NOTE — H&P (Signed)
Chief Complaint: "I'm here for a liver biopsy" Referring Physician:Patterson HPI: Monica Perkins is an 32 y.o. female with elevated liver enzymes and is referred for liver biopsy. PMHx and meds reviewed.  Past Medical History:  Past Medical History  Diagnosis Date  . Arthritis     knees  . Hypertension   . Migraines   . Fatty liver     [place]  . Gallstones     [place]    Past Surgical History:  Past Surgical History  Procedure Laterality Date  . Tonsillectomy    . Knee arthroscopy  10-2010    B knee surgeries, several    Family History:  Family History  Problem Relation Age of Onset  . Lung cancer    . Stroke Father   . Arthritis Father   . Cancer Father     lung  . Arthritis Mother   . Hyperlipidemia Mother   . Alcohol abuse Maternal Aunt   . Diabetes Maternal Aunt   . Arthritis Maternal Grandmother   . Heart disease Maternal Grandmother   . Hyperlipidemia Maternal Grandmother   . Hypertension Maternal Grandmother   . Arthritis Maternal Grandfather   . Heart disease Maternal Grandfather     Social History:  reports that she quit smoking about 16 months ago. She has never used smokeless tobacco. She reports that  drinks alcohol. She reports that she does not use illicit drugs.  Allergies:  Allergies  Allergen Reactions  . Cephalexin   . Penicillins     Medications: (Not in a hospital admission)  Please HPI for pertinent positives, otherwise complete 10 system ROS negative.  Physical Exam: Last menstrual period 04/12/2013. Temp: 97.7, HR: 104, RR: 18, BP: 150/88, O2: 98%   General Appearance:  Alert, cooperative, no distress, appears stated age  Head:  Normocephalic, without obvious abnormality, atraumatic  ENT: Unremarkable  Neck: Supple, symmetrical, trachea midline  Lungs:   Clear to auscultation bilaterally, no w/r/r, respirations unlabored without use of accessory muscles.  Heart:  Regular rate and rhythm, S1, S2 normal, no murmur, rub or gallop.    Abdomen:   Soft, non-tender, non distended. Bowel sounds active all four quadrants,  no masses, no organomegaly.  Neurologic: Normal affect, no gross deficits.   Results for orders placed during the hospital encounter of 04/26/13 (from the past 48 hour(s))  APTT     Status: None   Collection Time    04/26/13  8:15 AM      Result Value Range   aPTT 32  24 - 37 seconds  CBC     Status: None   Collection Time    04/26/13  8:15 AM      Result Value Range   WBC 8.8  4.0 - 10.5 K/uL   RBC 4.40  3.87 - 5.11 MIL/uL   Hemoglobin 13.8  12.0 - 15.0 g/dL   HCT 78.4  69.6 - 29.5 %   MCV 89.1  78.0 - 100.0 fL   MCH 31.4  26.0 - 34.0 pg   MCHC 35.2  30.0 - 36.0 g/dL   RDW 28.4  13.2 - 44.0 %   Platelets 255  150 - 400 K/uL  PROTIME-INR     Status: None   Collection Time    04/26/13  8:15 AM      Result Value Range   Prothrombin Time 12.9  11.6 - 15.2 seconds   INR 0.98  0.00 - 1.49   No results found.  Assessment/Plan  Elevated LFTs For US guided random liver biopsy. Discussed procedure, risks, complications. Labs reviewed. Consent signed in chart  Brayton El PA-C 04/26/2013, 9:42 AM

## 2013-05-04 ENCOUNTER — Ambulatory Visit (INDEPENDENT_AMBULATORY_CARE_PROVIDER_SITE_OTHER): Payer: Managed Care, Other (non HMO) | Admitting: Internal Medicine

## 2013-05-04 VITALS — BP 132/80 | HR 95 | Temp 98.0°F | Wt 259.0 lb

## 2013-05-04 DIAGNOSIS — J209 Acute bronchitis, unspecified: Secondary | ICD-10-CM

## 2013-05-04 MED ORDER — ALBUTEROL SULFATE HFA 108 (90 BASE) MCG/ACT IN AERS: 2.0000 | INHALATION_SPRAY | Freq: Four times a day (QID) | RESPIRATORY_TRACT | Status: DC | PRN

## 2013-05-04 MED ORDER — HYDROCODONE-HOMATROPINE 5-1.5 MG/5ML PO SYRP: 5.0000 mL | ORAL_SOLUTION | Freq: Four times a day (QID) | ORAL | Status: DC | PRN

## 2013-05-04 MED ORDER — AZITHROMYCIN 250 MG PO TABS: 250.0000 mg | ORAL_TABLET | Freq: Every day | ORAL | Status: AC

## 2013-05-04 NOTE — Patient Instructions (Addendum)
Rest, fluids , tylenol For cough, take Mucinex DM twice a day as needed  If the cough continue , use hydrocodone, will make you sleepy For congestion use Qnasl 2 sprays on each side of the nose daily until sample gone Take the antibiotic as prescribed  (zpack) Call if no better in few days Call anytime if the symptoms are severe

## 2013-05-04 NOTE — Progress Notes (Signed)
  Subjective:    Patient ID: Monica Perkins, female    DOB: [birth year], 32 y.o.   MRN: [id number]  HPI Acute visit Symptoms started 4 days ago with cough, sore throat, chest and sinus congestion, postnasal dripping. She had a hard time sleeping due to to cough, she's taking a leftover cough syrup. In the last couple of days, her sputum has become green. Although she is not wheezing, has used her partner's inhaler and she feels slightly better, able to bring up more sputum.  Past Medical History  Diagnosis Date  . Arthritis     knees  . Hypertension   . Migraines   . Fatty liver     [place]  . Gallstones     [place]   Past Surgical History  Procedure Laterality Date  . Tonsillectomy    . Knee arthroscopy  10-2010    B knee surgeries, several    Review of Systems No fever or chills No nausea, vomiting, diarrhea. No myalgias.     Objective:   Physical Exam BP 132/80  Pulse 95  Temp(Src) 98 F (36.7 C) (Oral)  Wt 259 lb (117.482 kg)  BMI 41.18 kg/m2  SpO2 97%  LMP 04/12/2013  General -- alert, well-developed, No distress except for frequent cough   HEENT -- TM Slightly bulge on the right, normal on the left. No redness or discharge. Throat w/o redness, face symmetric and slt  tender to palpation at the frontal but not at the maxillary sinuses  Lungs -- normal respiratory effort, no intercostal retractions, no accessory muscle use, and normal breath sounds.   Heart-- normal rate, regular rhythm, no murmur, and no gallop.    Neurologic-- alert & oriented X3 and strength normal in all extremities. Psych-- Cognition and judgment appear intact. Alert and cooperative with normal attention span and concentration.  not anxious appearing and not depressed appearing.      Assessment & Plan:  Bronchitis, Bronchitis with severe cough, atypical infection? Plan: Zithromax, cough suppression with Mucinex DM hydrocodone.  She also seems to be benefiting from ventolin; although she does not  have a history of asthma or wheezing today, I gave her a prescription for albuterol. See instructions.

## 2013-05-05 ENCOUNTER — Encounter: Payer: Self-pay | Admitting: Internal Medicine

## 2013-05-10 ENCOUNTER — Other Ambulatory Visit: Payer: Self-pay | Admitting: Internal Medicine

## 2013-05-10 ENCOUNTER — Telehealth: Payer: Self-pay | Admitting: Family Medicine

## 2013-05-10 MED ORDER — HYDROCODONE-HOMATROPINE 5-1.5 MG/5ML PO SYRP: 5.0000 mL | ORAL_SOLUTION | Freq: Four times a day (QID) | ORAL | Status: DC | PRN

## 2013-05-10 NOTE — Telephone Encounter (Signed)
Rx faxed.    KP 

## 2013-05-10 NOTE — Telephone Encounter (Signed)
Rx sent, left Pt detail VM ok per [facility]

## 2013-05-10 NOTE — Telephone Encounter (Signed)
Refill x1 

## 2013-05-10 NOTE — Telephone Encounter (Signed)
Pt was called back. Advised that during the day her cough is good, however it feels as though her throat gets "clogged", this is the only time she uses the medication. Please advise.

## 2013-05-10 NOTE — Telephone Encounter (Signed)
Patient called requesting refill on hydrocodone cough syrup we gave her last week. She states she is almost out.

## 2013-05-11 ENCOUNTER — Telehealth: Payer: Self-pay | Admitting: Gastroenterology

## 2013-05-11 NOTE — Telephone Encounter (Signed)
She has fatty liver but no cirrhosis.  She needs to lose weight or have bariatric surgery.  She also needs to discontinue vitamin C and iron because of her high serum ferritin level with negative hemochromatosis genetic studies.  Her liver biopsy does not look like autoimmune liver disease or primary biliary cirrhosis.  All be glad to see her in the office for followup if she desires.  The main problem years obesity and fatty liver, and she cannot lose weight voluntarily, I would strongly recommend bariatric surgery because of her fatty liver and young age.

## 2013-05-11 NOTE — Telephone Encounter (Signed)
Informed pt of results and Dr Norval Gable suggestions/instructions. Offered to make her an appt to discuss, but she stated understanding. She also stated understanding with the fact that she needs to lose weight or have surgery d/t her age and early dx of fatty liver disease

## 2013-05-11 NOTE — Telephone Encounter (Signed)
Dr Jarold Motto, please review path on Liver Bx and advise on results for pt. Thanks.

## 2013-05-25 ENCOUNTER — Encounter: Payer: Managed Care, Other (non HMO) | Attending: Family Medicine | Admitting: *Deleted

## 2013-05-25 ENCOUNTER — Encounter: Payer: Self-pay | Admitting: *Deleted

## 2013-05-25 DIAGNOSIS — Z713 Dietary counseling and surveillance: Secondary | ICD-10-CM | POA: Insufficient documentation

## 2013-05-25 DIAGNOSIS — E669 Obesity, unspecified: Secondary | ICD-10-CM | POA: Insufficient documentation

## 2013-05-25 NOTE — Progress Notes (Signed)
Medical Nutrition Therapy:  Appt start time: 1415 end time:  1515.  Assessment:  Primary concern today: Obesity. Patient reports a desire to lose weight. She reports that she thinks she eats pretty healthy, but does not know why she can't lose weight. Lunch is her largest meal, eating out frequently. However, food choices are healthier. Dinner is varied. She exercised frequently in the past, but got discouraged because she gained some weight while doing that. She typically swims or uses the elliptical and weights due to history of knee surgery. Her weight was stable at 240-245 pounds, but she has gained 15-20 pounds in the last 6 months. She reports eating mostly vegetarian.   WEIGHT: 260 pounds BMI: 40.8  MEDICATIONS: Benicar, Lexipro   DIETARY INTAKE:   Usual eating pattern includes 3 meals and 1 snacks per day.  24-hr recall:  B ( AM): Leftovers, 2 scrambled eggs with toast, soda (Regular) 12-20 oz Snk ( AM): None  L ( PM): Eats out x 3: [employer] (burrito bowl or salad), [employer] (gyro with salad), Sandwich with chips/salad, leftovers, water Snk ( PM): None D ( PM): Leftovers from lunch, vegan chicken nuggets, OR vegetables and rice, water Snk ( PM): Sometimes, 10-15 gummy bears Beverages: water, soda  Usual physical activity: Infrequent, occasionally swims/elliptical at the gym, [place], gardens  Estimated energy needs: 1800 calories 225 g carbohydrates 113 g protein 50 g fat  Progress Towards Goal(s):  In progress.   Nutritional Diagnosis:  [place]-3.3 Overweight/obesity As related to excessive energy intake and physical inactivity.  As evidenced by BMI 40.8.    Intervention:  Nutrition counseling. We discussed strategies for weight loss, including balancing nutrients (carbs, protein, fat), portion control, healthy snacks, and exercise.   Goals:  1. 1 pound weight loss per week. Goal weight: 235 pounds.  2. Monitor portion size of foods. Choose healthy options when eating out,  save half for later if portions large.  3. Choose healthy snacks: fruit, yogurt, hummus, crackers, peanut butter, carrots 4. Eat a balanced dinner. Increase vegetables, moderate portions of protein/starch 5. Exercise 3 days a weeks for at least 30 minutes.   Handouts given during visit include:  Weight loss tips  Yellow meal plan card  Monitoring/Evaluation:  Dietary intake, exercise, and body weight in 2 month(s).

## 2013-07-20 ENCOUNTER — Encounter: Payer: Managed Care, Other (non HMO) | Attending: Family Medicine | Admitting: *Deleted

## 2013-07-20 ENCOUNTER — Encounter: Payer: Self-pay | Admitting: *Deleted

## 2013-07-20 VITALS — Ht 67.0 in | Wt 262.8 lb

## 2013-07-20 DIAGNOSIS — Z713 Dietary counseling and surveillance: Secondary | ICD-10-CM | POA: Insufficient documentation

## 2013-07-20 DIAGNOSIS — E669 Obesity, unspecified: Secondary | ICD-10-CM | POA: Insufficient documentation

## 2013-07-20 NOTE — Progress Notes (Signed)
Medical Nutrition Therapy:  Appt start time: 1445 end time:  1515.  Assessment:  Patient returns today for a follow up visit. She reports that she has made many healthy changes, including limiting portion size and choosing healthier snacks. She has not been exercising, but does yard work 3 days a week. She is disappointed because she thought she would have lost weight, but has gained 2.8 pounds in 2 months. She continues to drink 12-20 ounces of soda daily.   MEDICATIONS: Reviewed   DIETARY INTAKE:   Usual eating pattern includes 3 meals and 1-2 snacks per day.  Estimated energy needs: 1800 calories  225 g carbohydrates  113 g protein  50 g fat  Usual physical activity: Outside more, yard work, gardening, 3 days weekly  Progress Towards Goal(s):  In progress.   Nutritional Diagnosis:  [place]-3.3 Overweight/obesity As related to excessive energy intake and physical inactivity. As evidenced by BMI 41.2.  Intervention:  Nutrition counseling. Reinforced strategies for weight loss. We focused on increasing physical activity by moving more throughout the day and increasing aerobic activities. We also discussed tracking diet to see how she is doing in terms of caloric intake. She was praised for the progress she's made changing her dietary habits.   Goals 1. 1 pound weight loss per week. Goal weight: 235 pounds.  2. Continue to monitor portion size of foods. Choose healthy options when eating out, save half for later if portions large.  3. Continue to choose healthy snacks: fruit, yogurt, hummus, crackers, peanut butter, carrots  4. Eat a balanced dinner. Increase vegetables, moderate portions of protein/starch  5. Exercise 3-4 days a weeks for at least 30 minutes (gym, yard work). Increase daily activity by walking on breaks, taking the stairs, etc.   Monitoring/Evaluation:  Dietary intake, exercise, and body weight in 2 month(s).

## 2013-09-07 ENCOUNTER — Ambulatory Visit (INDEPENDENT_AMBULATORY_CARE_PROVIDER_SITE_OTHER): Payer: Managed Care, Other (non HMO) | Admitting: Family Medicine

## 2013-09-07 ENCOUNTER — Encounter: Payer: Self-pay | Admitting: Family Medicine

## 2013-09-07 VITALS — BP 130/88 | HR 110 | Temp 98.3°F | Wt 266.2 lb

## 2013-09-07 DIAGNOSIS — J019 Acute sinusitis, unspecified: Secondary | ICD-10-CM

## 2013-09-07 MED ORDER — SULFAMETHOXAZOLE-TMP DS 800-160 MG PO TABS: 1.0000 | ORAL_TABLET | Freq: Two times a day (BID) | ORAL | Status: DC

## 2013-09-07 MED ORDER — PROMETHAZINE-DM 6.25-15 MG/5ML PO SYRP: 5.0000 mL | ORAL_SOLUTION | Freq: Four times a day (QID) | ORAL | Status: DC | PRN

## 2013-09-07 NOTE — Progress Notes (Signed)
  Subjective:    Patient ID: Monica Perkins, female    DOB: March 25, 1981, 32 y.o.   MRN: [id number]  HPI URI- sxs started Monday w/ sore throat and clogged ears.  Now w/ ear pain, sinus pain/pressure, green nasal drainage.  + PND.  + cough- worse w/ lying down.  + intermittent low grade temps.  No N/V/D.  + sick contacts.   Review of Systems For ROS see HPI     Objective:   Physical Exam  Vitals reviewed. Constitutional: She appears well-developed and well-nourished. No distress.  HENT:  Head: Normocephalic and atraumatic.  Right Ear: Tympanic membrane normal.  Left Ear: Tympanic membrane normal.  Nose: Mucosal edema and rhinorrhea present. Right sinus exhibits maxillary sinus tenderness. Right sinus exhibits no frontal sinus tenderness. Left sinus exhibits maxillary sinus tenderness. Left sinus exhibits no frontal sinus tenderness.  Mouth/Throat: Uvula is midline and mucous membranes are normal. Posterior oropharyngeal erythema present. No oropharyngeal exudate.  Eyes: Conjunctivae and EOM are normal. Pupils are equal, round, and reactive to light.  Neck: Normal range of motion. Neck supple.  Cardiovascular: Normal rate, regular rhythm and normal heart sounds.   Pulmonary/Chest: Effort normal and breath sounds normal. No respiratory distress. She has no wheezes.  Lymphadenopathy:    She has no cervical adenopathy.          Assessment & Plan:

## 2013-09-07 NOTE — Assessment & Plan Note (Signed)
sxs and PE consistent w/ infxn.  Start abx- Bactrim due to allergies.  Cough syrup prn.  Reviewed supportive care and red flags that should prompt return.  Pt expressed understanding and is in agreement w/ plan.

## 2013-09-07 NOTE — Patient Instructions (Addendum)
This is a sinus infection Start the Bactrim twice daily- take w/ food Drink plenty of fluids Use the cough syrup as needed- may cause fatigue REST! Hang in there!

## 2013-09-15 ENCOUNTER — Telehealth: Payer: Self-pay | Admitting: Family Medicine

## 2013-09-15 NOTE — Telephone Encounter (Signed)
Patient was in 1 week ago and saw Dr. Beverely Low for a sinus infection. She states the infection is gone, however, still has a lot of drainage and coughing. Pt wants to know if there is something she can take to clear these remaining symptoms up.

## 2013-09-16 ENCOUNTER — Telehealth: Payer: Self-pay | Admitting: *Deleted

## 2013-09-16 MED ORDER — GUAIFENESIN-CODEINE 100-10 MG/5ML PO SYRP: 5.0000 mL | ORAL_SOLUTION | Freq: Three times a day (TID) | ORAL | Status: DC | PRN

## 2013-09-16 NOTE — Telephone Encounter (Signed)
Pt called and stated that she was seen 2 weeks ago for a sinus infection. Antibiotics and cough syrup was given. Pt called in today and stated that infection has cleared up but still has the cough. Spoke with Dr. Beverely Low. Pt was instructed to start Claritin or Zyrtec and a new Rx was faxed to the pharmacy for the cough. SW, CMA

## 2013-09-16 NOTE — Telephone Encounter (Signed)
Patient is calling back. She is asking for a call back before the weekend.

## 2013-09-22 ENCOUNTER — Ambulatory Visit: Payer: Managed Care, Other (non HMO) | Admitting: *Deleted

## 2013-11-03 ENCOUNTER — Other Ambulatory Visit: Payer: Self-pay

## 2014-01-05 ENCOUNTER — Encounter: Payer: Self-pay | Admitting: Family Medicine

## 2014-01-05 ENCOUNTER — Ambulatory Visit (INDEPENDENT_AMBULATORY_CARE_PROVIDER_SITE_OTHER): Payer: Managed Care, Other (non HMO) | Admitting: Family Medicine

## 2014-01-05 VITALS — BP 126/70 | HR 95 | Temp 98.3°F | Wt 269.2 lb

## 2014-01-05 DIAGNOSIS — R05 Cough: Secondary | ICD-10-CM

## 2014-01-05 DIAGNOSIS — R059 Cough, unspecified: Secondary | ICD-10-CM

## 2014-01-05 DIAGNOSIS — J329 Chronic sinusitis, unspecified: Secondary | ICD-10-CM

## 2014-01-05 MED ORDER — CLARITHROMYCIN ER 500 MG PO TB24
1000.0000 mg | ORAL_TABLET | Freq: Every day | ORAL | Status: AC
Start: 2014-01-05 — End: 2014-01-15

## 2014-01-05 MED ORDER — GUAIFENESIN-CODEINE 100-10 MG/5ML PO SYRP: ORAL_SOLUTION | ORAL | Status: DC

## 2014-01-05 NOTE — Progress Notes (Signed)
Pre visit review using our clinic review tool, if applicable. No additional management support is needed unless otherwise documented below in the visit note. 

## 2014-01-05 NOTE — Patient Instructions (Signed)

## 2014-01-05 NOTE — Progress Notes (Signed)
  Subjective:     Monica Perkins is a 33 y.o. female who presents for evaluation of sinus pain. Symptoms include: congestion, cough, facial pain, headaches, nasal congestion, sinus pressure and sore throat. Onset of symptoms was 6 days ago. Symptoms have been gradually worsening since that time. Past history is significant for no history of pneumonia or bronchitis. Patient is a non-smoker.  The following portions of the patient's history were reviewed and updated as appropriate:  She  has a past medical history of Arthritis; Hypertension; Migraines; Fatty liver; and Gallstones. She  does not have any pertinent problems on file. She  has past surgical history that includes Tonsillectomy and Knee arthroscopy (10-2010). Her family history includes Alcohol abuse in her maternal aunt; Arthritis in her father, maternal grandfather, maternal grandmother, and mother; Cancer in her father; Diabetes in her maternal aunt; Heart disease in her maternal grandfather and maternal grandmother; Hyperlipidemia in her maternal grandmother and mother; Hypertension in her maternal grandmother; Lung cancer in an other family member; Stroke in her father. She  reports that she quit smoking about 2 years ago. She has never used smokeless tobacco. She reports that she drinks alcohol. She reports that she does not use illicit drugs. She has a current medication list which includes the following prescription(s): drospirenone-ethinyl estradiol, escitalopram, olmesartan-hydrochlorothiazide, and pediatric multiple vit-c-fa. Current Outpatient Prescriptions on File Prior to Visit  Medication Sig Dispense Refill  . drospirenone-ethinyl estradiol (YAZ) 3-0.02 MG tablet 1 tab by mouth daily-  3 Package  3  . escitalopram (LEXAPRO) 10 MG tablet Take 10 mg by mouth daily.      Marland Kitchen olmesartan-hydrochlorothiazide (BENICAR HCT) 20-12.5 MG per tablet Take 1 tablet by mouth daily.  90 tablet  3  . Pediatric Multiple Vit-C-FA (FLINSTONES  GUMMIES OMEGA-3 DHA PO) Take 2 tablets by mouth daily.       No current facility-administered medications on file prior to visit.   She is allergic to cephalexin and penicillins..  Review of Systems Pertinent items are noted in HPI.   Objective:    BP 126/70  Pulse 95  Temp(Src) 98.3 F (36.8 C) (Oral)  Wt 269 lb 3.2 oz (122.108 kg)  SpO2 97% General appearance: alert, cooperative, appears stated age and no distress Eyes: conjunctivae/corneas clear. PERRL, EOM's intact. Fundi benign. Ears: normal TM's and external ear canals both ears Nose: green discharge, moderate congestion, turbinates red, swollen, sinus tenderness bilateral Throat: abnormal findings: mild oropharyngeal erythema and pnd Neck: mild anterior cervical adenopathy, supple, symmetrical, trachea midline and thyroid not enlarged, symmetric, no tenderness/mass/nodules Lungs: clear to auscultation bilaterally and occassional wheeze with cough Heart: S1, S2 normal Lymph nodes: Cervical adenopathy: + adenopathy     Assessment:    Acute bacterial sinusitis.    Plan:    Nasal steroids per medication orders. Antihistamines per medication orders. Biaxin per medication orders.

## 2014-03-08 ENCOUNTER — Other Ambulatory Visit: Payer: Self-pay | Admitting: Family Medicine

## 2014-03-17 ENCOUNTER — Telehealth: Payer: Self-pay

## 2014-03-17 NOTE — Telephone Encounter (Signed)
Left message for call back.

## 2014-03-20 ENCOUNTER — Encounter: Payer: Managed Care, Other (non HMO) | Admitting: Family Medicine

## 2014-03-22 NOTE — Telephone Encounter (Signed)
Appointment rescheduled.

## 2014-04-04 ENCOUNTER — Ambulatory Visit (INDEPENDENT_AMBULATORY_CARE_PROVIDER_SITE_OTHER): Payer: Managed Care, Other (non HMO) | Admitting: Family Medicine

## 2014-04-04 ENCOUNTER — Encounter: Payer: Self-pay | Admitting: Family Medicine

## 2014-04-04 VITALS — BP 140/84 | HR 95 | Temp 98.1°F | Wt 274.0 lb

## 2014-04-04 DIAGNOSIS — L551 Sunburn of second degree: Secondary | ICD-10-CM

## 2014-04-04 MED ORDER — PREDNISONE 10 MG PO TABS: ORAL_TABLET | ORAL | Status: DC

## 2014-04-04 MED ORDER — SILVER SULFADIAZINE 1 % EX CREA: 1.0000 "application " | TOPICAL_CREAM | Freq: Every day | CUTANEOUS | Status: DC

## 2014-04-04 NOTE — Progress Notes (Signed)
Patient ID: Monica Perkins, female   DOB: 1981/04/09, 33 y.o.   MRN: [id number]   Subjective:    Patient ID: Monica Perkins, female    DOB: 06/05/1981, 33 y.o.   MRN: [id number] HPI Pt here c/o severe sunburn on shoulders, face and chest ---occurred when kayaking Sunday and she forgot her sunscreen.  It is very painful.  She has used otc with no relief.         Objective:    BP 140/84  Pulse 95  Temp(Src) 98.1 F (36.7 C) (Oral)  Wt 274 lb (124.286 kg)  SpO2 98% General appearance: alert, cooperative, appears stated age and no distress Skin: Skin color, texture, turgor normal. No rashes or lesions or erythema - face, neck, shoulder(s) bilateral, chest and thighs        Assessment & Plan:  1. Sunburn, blistering Use sunscreen and reapply frequently.   - silver sulfADIAZINE (SILVADENE) 1 % cream; Apply 1 application topically daily.  Dispense: 50 g; Refill: 0 - predniSONE (DELTASONE) 10 MG tablet; 3 po qd for 3 days then 2 po qd for 3 days the 1 po qd for 3 days  Dispense: 18 tablet; Refill: 0

## 2014-04-04 NOTE — Patient Instructions (Signed)
Sunburn Sunburn is damage to the skin caused by overexposure to ultraviolet (UV) rays. People with light skin or a fair complexion may be more susceptible to sunburn. Repeated sun exposure causes early skin aging such as wrinkles and sun spots. It also increases the risk of skin cancer. CAUSES A sunburn is caused by getting too much UV radiation from the sun. SYMPTOMS  Red or pink skin.  Soreness and swelling.  Pain.  Blisters.  Peeling skin.  Headache, fever, and fatigue if sunburn covers a large area. TREATMENT  Your caregiver may tell you to take certain medicines to lessen inflammation.  Your caregiver may have you use hydrocortisone cream or spray to help with itching and inflammation.  Your caregiver may prescribe an antibiotic cream to use on blisters. HOME CARE INSTRUCTIONS   Avoid further exposure to the sun.  Cool baths and cool compresses may be helpful if used several times per day. Do not apply ice, since this may result in more damage to the skin.  Only take over-the-counter or prescription medicines for pain, discomfort, or fever as directed by your caregiver.  Use aloe or other over-the-counter sunburn creams or gels on your skin. Do not apply these creams or gels on blisters.  Drink enough fluids to keep your urine clear or pale yellow.  Do not break blisters. If blisters break, your caregiver may recommend an antibiotic cream to apply to the affected area. PREVENTION   Try to avoid the sun between 10:00 a.m. and 4:00 p.m. when it is the strongest.  Apply sunscreen at least 30 minutes before exposure to the sun.  Always wear protective hats, clothing, and sunglasses with UV protection.  Avoid medicines, herbs, and foods that increase your sensitivity to sunlight.  Avoid tanning beds. SEEK IMMEDIATE MEDICAL CARE IF:   You have a fever.  Your pain is uncontrolled with medicine.  You start to vomit or have diarrhea.  You feel faint or develop a  headache with confusion.  You develop severe blistering.  You have a pus-like (purulent) discharge coming from the blisters.  Your burn becomes more painful and swollen. MAKE SURE YOU:  Understand these instructions.  Will watch your condition.  Will get help right away if you are not doing well or get worse. Document Released: 09/24/2005 Document Revised: 04/11/2013 Document Reviewed: 06/08/2011 [employer] Patient Information 2014 [employer].  

## 2014-04-04 NOTE — Progress Notes (Signed)
Pre visit review using our clinic review tool, if applicable. No additional management support is needed unless otherwise documented below in the visit note. 

## 2014-04-08 ENCOUNTER — Other Ambulatory Visit: Payer: Self-pay | Admitting: Family Medicine

## 2014-04-10 ENCOUNTER — Ambulatory Visit (INDEPENDENT_AMBULATORY_CARE_PROVIDER_SITE_OTHER): Payer: Managed Care, Other (non HMO) | Admitting: Family Medicine

## 2014-04-10 ENCOUNTER — Encounter: Payer: Self-pay | Admitting: Family Medicine

## 2014-04-10 VITALS — BP 144/96 | HR 88 | Temp 98.2°F | Ht 67.0 in | Wt 274.0 lb

## 2014-04-10 DIAGNOSIS — Z0000 Encounter for general adult medical examination without abnormal findings: Secondary | ICD-10-CM

## 2014-04-10 DIAGNOSIS — E669 Obesity, unspecified: Secondary | ICD-10-CM | POA: Insufficient documentation

## 2014-04-10 DIAGNOSIS — Z23 Encounter for immunization: Secondary | ICD-10-CM

## 2014-04-10 DIAGNOSIS — D229 Melanocytic nevi, unspecified: Secondary | ICD-10-CM

## 2014-04-10 DIAGNOSIS — I10 Essential (primary) hypertension: Secondary | ICD-10-CM

## 2014-04-10 DIAGNOSIS — D239 Other benign neoplasm of skin, unspecified: Secondary | ICD-10-CM

## 2014-04-10 MED ORDER — OLMESARTAN MEDOXOMIL-HCTZ 20-12.5 MG PO TABS: 1.0000 | ORAL_TABLET | Freq: Every day | ORAL | Status: DC

## 2014-04-10 NOTE — Patient Instructions (Signed)

## 2014-04-10 NOTE — Progress Notes (Signed)
Subjective:     Monica Perkins is a 33 y.o. female and is here for a comprehensive physical exam. The patient reports problems - suspicious moles.  History   Social History  . Marital Status: Single    Spouse Name: N/A    Number of Children: 0  . 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 Smoking status: Former Smoker    Quit date: 12/12/2011  . Smokeless tobacco: Never Used  . Alcohol Use: Yes     Comment: rarely   . Drug Use: No  . Sexual Activity: Not on file   Other Topics Concern  . Not on file   Social History Narrative   [place] partner   Health Maintenance  Topic Date Due  . Influenza Vaccine  07/29/2014  . Tetanus/tdap  08/07/2014  . Pap Smear  03/17/2016    The following portions of the patient's history were reviewed and updated as appropriate:  She  has a past medical history of Arthritis; Hypertension; Migraines; Fatty liver; and Gallstones. She  does not have any pertinent problems on file. She  has past surgical history that includes Tonsillectomy and Knee arthroscopy (10-2010). Her family history includes Alcohol abuse in her maternal aunt; Arthritis in her father, maternal grandfather, maternal grandmother, and mother; Cancer in her father; Diabetes in her maternal aunt; Heart disease in her maternal grandfather and maternal grandmother; Hyperlipidemia in her maternal grandmother and mother; Hypertension in her maternal grandmother; Lung cancer in an other family member; Stroke in her father. She  reports that she quit smoking about 2 years ago. She has never used smokeless tobacco. She reports that she drinks alcohol. She reports that she does not use illicit drugs. She has a current medication list which includes the following prescription(s): escitalopram, loratadine-pseudoephedrine, loryna, olmesartan-hydrochlorothiazide, pediatric multiple vit-c-fa, prednisone, and silver  sulfadiazine. Current Outpatient Prescriptions on File Prior to Visit  Medication Sig Dispense Refill  . escitalopram (LEXAPRO) 10 MG tablet TAKE 2 TABLETS (20 MG TOTAL) BY MOUTH DAILY.  180 tablet  1  . loratadine-pseudoephedrine (CLARITIN-D 24-HOUR) 10-240 MG per 24 hr tablet Take 1 tablet by mouth daily.      . LORYNA 3-0.02 MG tablet 1 TAB BY MOUTH DAILY-  84 tablet  0  . Pediatric Multiple Vit-C-FA (FLINSTONES GUMMIES OMEGA-3 DHA PO) Take 2 tablets by mouth daily.      . predniSONE (DELTASONE) 10 MG tablet 3 po qd for 3 days then 2 po qd for 3 days the 1 po qd for 3 days  18 tablet  0  . silver sulfADIAZINE (SILVADENE) 1 % cream Apply 1 application topically daily.  50 g  0   No current facility-administered medications on file prior to visit.   She is allergic to cephalexin and penicillins..  Review of Systems Review of Systems  Constitutional: Negative for activity change, appetite change and fatigue.  HENT: Negative for hearing loss, congestion, tinnitus and ear discharge.  dentist q82m Eyes: Negative for visual disturbance (see optho q1y -- vision corrected to 20/20 with glasses).  Respiratory: Negative for cough, chest tightness and shortness of breath.   Cardiovascular: Negative for chest pain, palpitations and leg swelling.  Gastrointestinal: Negative for abdominal pain, diarrhea, constipation and abdominal distention.  Genitourinary: Negative for urgency, frequency, decreased urine volume and difficulty urinating.  Musculoskeletal: Negative for back pain, arthralgias and gait problem.  Skin: Negative for color change, pallor  and rash.  Neurological: Negative for dizziness, light-headedness, numbness and headaches.  Hematological: Negative for adenopathy. Does not bruise/bleed easily.  Psychiatric/Behavioral: Negative for suicidal ideas, confusion, sleep disturbance, self-injury, dysphoric mood, decreased concentration and agitation.       Objective:    BP 144/96  Pulse  88  Temp(Src) 98.2 F (36.8 C) (Oral)  Ht 5\' 7"  (1.702 m)  Wt 274 lb (124.286 kg)  BMI 42.90 kg/m2  SpO2 98%  LMP 02/26/2014 General appearance: alert, cooperative, appears stated age and no distress Head: Normocephalic, without obvious abnormality, atraumatic Eyes: conjunctivae/corneas clear. PERRL, EOM's intact. Fundi benign. Ears: normal TM's and external ear canals both ears Nose: Nares normal. Septum midline. Mucosa normal. No drainage or sinus tenderness. Throat: lips, mucosa, and tongue normal; teeth and gums normal Neck: no adenopathy, no carotid bruit, no JVD, supple, symmetrical, trachea midline and thyroid not enlarged, symmetric, no tenderness/mass/nodules Back: symmetric, no curvature. ROM normal. No CVA tenderness. Lungs: clear to auscultation bilaterally Breasts: normal appearance, no masses or tenderness Heart: regular rate and rhythm, S1, S2 normal, no murmur, click, rub or gallop Abdomen: soft, non-tender; bowel sounds normal; no masses,  no organomegaly Pelvic: deferred Extremities: extremities normal, atraumatic, no cyanosis or edema Pulses: 2+ and symmetric Skin: nevi - shoulder(s) right, back--- multiple suspicous Lymph nodes: Cervical, supraclavicular, and axillary nodes normal. Neurologic: Alert and oriented X 3, normal strength and tone. Normal symmetric reflexes. Normal coordination and gait Psych-- no depression no anxiety      Assessment:    Healthy female exam.      Plan:     ghm utd Check labs  See After Visit Summary for Counseling Recommendations   1. HTN (hypertension) Slightly high today but normally runs normal - olmesartan-hydrochlorothiazide (BENICAR HCT) 20-12.5 MG per tablet; Take 1 tablet by mouth daily.  Dispense: 90 tablet; Refill: 3 - Basic metabolic panel; Future  2. Preventative health care  - Basic metabolic panel; Future - CBC with Differential; Future - Hepatic function panel; Future - Lipid panel; Future - Microalbumin  / creatinine urine ratio; Future - POCT urinalysis dipstick; Future - TSH; Future  3. Numerous moles  - Ambulatory referral to Dermatology

## 2014-04-10 NOTE — Progress Notes (Signed)
Pre visit review using our clinic review tool, if applicable. No additional management support is needed unless otherwise documented below in the visit note. 

## 2014-04-10 NOTE — Addendum Note (Signed)
Addended by: Ewing Schlein on: 04/10/2014 04:19 PM   Modules accepted: Orders

## 2014-04-12 ENCOUNTER — Ambulatory Visit: Payer: Managed Care, Other (non HMO)

## 2014-04-12 ENCOUNTER — Other Ambulatory Visit (INDEPENDENT_AMBULATORY_CARE_PROVIDER_SITE_OTHER): Payer: Managed Care, Other (non HMO)

## 2014-04-12 DIAGNOSIS — Z0000 Encounter for general adult medical examination without abnormal findings: Secondary | ICD-10-CM

## 2014-04-12 DIAGNOSIS — R7309 Other abnormal glucose: Secondary | ICD-10-CM

## 2014-04-12 DIAGNOSIS — I10 Essential (primary) hypertension: Secondary | ICD-10-CM

## 2014-04-12 DIAGNOSIS — N390 Urinary tract infection, site not specified: Secondary | ICD-10-CM

## 2014-04-12 LAB — POCT URINALYSIS DIPSTICK
Bilirubin, UA: NEGATIVE
Blood, UA: NEGATIVE
Glucose, UA: NEGATIVE
KETONES UA: NEGATIVE
Nitrite, UA: NEGATIVE
PH UA: 7
PROTEIN UA: NEGATIVE
SPEC GRAV UA: 1.01
UROBILINOGEN UA: 0.2

## 2014-04-12 LAB — HEMOGLOBIN A1C: Hgb A1c MFr Bld: 8.1 % — ABNORMAL HIGH (ref 4.6–6.5)

## 2014-04-12 LAB — MICROALBUMIN / CREATININE URINE RATIO
CREATININE, U: 179.2 mg/dL
Microalb Creat Ratio: 0.5 mg/g (ref 0.0–30.0)
Microalb, Ur: 0.9 mg/dL (ref 0.0–1.9)

## 2014-04-12 LAB — BASIC METABOLIC PANEL
BUN: 9 mg/dL (ref 6–23)
CALCIUM: 9 mg/dL (ref 8.4–10.5)
CO2: 27 mEq/L (ref 19–32)
Chloride: 103 mEq/L (ref 96–112)
Creatinine, Ser: 0.6 mg/dL (ref 0.4–1.2)
GFR: 119.57 mL/min (ref >60.00)
Glucose, Bld: 162 mg/dL — ABNORMAL HIGH (ref 70–99)
Potassium: 3.8 mEq/L (ref 3.5–5.1)
SODIUM: 138 meq/L (ref 135–145)

## 2014-04-12 LAB — CBC WITH DIFFERENTIAL/PLATELET
BASOS ABS: 0.1 10*3/uL (ref 0.0–0.1)
BASOS PCT: 1 % (ref 0.0–3.0)
EOS ABS: 0.3 10*3/uL (ref 0.0–0.7)
Eosinophils Relative: 2.8 % (ref 0.0–5.0)
HCT: 37.9 % (ref 36.0–46.0)
HEMOGLOBIN: 12.9 g/dL (ref 12.0–15.0)
LYMPHS PCT: 24.8 % (ref 12.0–46.0)
Lymphs Abs: 2.7 10*3/uL (ref 0.7–4.0)
MCHC: 34.2 g/dL (ref 30.0–36.0)
MCV: 94.1 fl (ref 78.0–100.0)
Monocytes Absolute: 0.4 10*3/uL (ref 0.1–1.0)
Monocytes Relative: 4 % (ref 3.0–12.0)
NEUTROS ABS: 7.5 10*3/uL (ref 1.4–7.7)
Neutrophils Relative %: 67.4 % (ref 43.0–77.0)
Platelets: 229 10*3/uL (ref 150.0–400.0)
RBC: 4.03 Mil/uL (ref 3.87–5.11)
RDW: 13.3 % (ref 11.5–14.6)
WBC: 11.1 10*3/uL — ABNORMAL HIGH (ref 4.5–10.5)

## 2014-04-12 LAB — HEPATIC FUNCTION PANEL
ALBUMIN: 3.6 g/dL (ref 3.5–5.2)
ALT: 180 U/L — ABNORMAL HIGH (ref 0–35)
AST: 148 U/L — ABNORMAL HIGH (ref 0–37)
Alkaline Phosphatase: 72 U/L (ref 39–117)
BILIRUBIN DIRECT: 0.1 mg/dL (ref 0.0–0.3)
TOTAL PROTEIN: 7.1 g/dL (ref 6.0–8.3)
Total Bilirubin: 0.7 mg/dL (ref 0.3–1.2)

## 2014-04-12 LAB — LIPID PANEL
CHOL/HDL RATIO: 5
Cholesterol: 175 mg/dL (ref 0–200)
HDL: 38.2 mg/dL — AB (ref >39.00)
LDL Cholesterol: 98 mg/dL (ref 0–99)
Triglycerides: 194 mg/dL — ABNORMAL HIGH (ref 0.0–149.0)
VLDL: 38.8 mg/dL (ref 0.0–40.0)

## 2014-04-12 LAB — TSH: TSH: 3.77 u[IU]/mL (ref 0.35–5.50)

## 2014-04-13 ENCOUNTER — Telehealth: Payer: Self-pay | Admitting: Family Medicine

## 2014-04-13 DIAGNOSIS — I10 Essential (primary) hypertension: Secondary | ICD-10-CM

## 2014-04-13 MED ORDER — OLMESARTAN MEDOXOMIL-HCTZ 20-12.5 MG PO TABS: 1.0000 | ORAL_TABLET | Freq: Every day | ORAL | Status: DC

## 2014-04-13 NOTE — Telephone Encounter (Signed)
MSG left advising Rx has been faxed to both pharmacies.       KP

## 2014-04-13 NOTE — Telephone Encounter (Signed)
Patient called stating the pharmacy did not receive her benicar. [facility] [address] and a 90 day supply sent [facility].

## 2014-04-14 ENCOUNTER — Telehealth: Payer: Self-pay

## 2014-04-14 NOTE — Telephone Encounter (Signed)
Left message for call back.

## 2014-04-14 NOTE — Telephone Encounter (Signed)
Results   Urine Culture (Order [id number])       Urine Culture  Status: Preliminary result     Visible to patient: This result is not viewable by the patient.     Next appt: None     Dx: Urinary tract infection, site not spe...              2d ago   Colony Count >=100,000 COLONIES/ML P    Preliminary Report ENTEROCOCCUS SPECIES P    Resulting Agency [facility]      Result Narrative        Performed at:  [address], [address]                [place], [place] [address]         Specimen Collected: 04/12/14  5:00 PM Last Resulted: 04/14/14  4:24 AM            P=Value has a preliminary status        Please advise

## 2014-04-14 NOTE — Telephone Encounter (Signed)
Advise patient, urine culture is showing any infection Start Macrobid 100 mg one by mouth twice a day for 3 days #6 no refills

## 2014-04-15 LAB — URINE CULTURE: Colony Count: >=100000

## 2014-04-17 ENCOUNTER — Other Ambulatory Visit: Payer: Self-pay

## 2014-04-17 MED ORDER — NITROFURANTOIN MONOHYD MACRO 100 MG PO CAPS: 100.0000 mg | ORAL_CAPSULE | Freq: Two times a day (BID) | ORAL | Status: DC

## 2014-04-17 NOTE — Telephone Encounter (Signed)
Spoke to patient.  Made patient aware of the presence of infection and that the [facility] [address]  She stated that she was asymptomatic, but stated understanding and that she would pick up her prescription later today.  No further questions or concerns voiced.    Rx. Sent.  Patient aware.

## 2014-05-10 ENCOUNTER — Other Ambulatory Visit: Payer: Self-pay

## 2014-06-05 ENCOUNTER — Telehealth: Payer: Self-pay | Admitting: Family Medicine

## 2014-06-05 MED ORDER — DROSPIRENONE-ETHINYL ESTRADIOL 3-0.02 MG PO TABS: ORAL_TABLET | ORAL | Status: DC

## 2014-06-05 NOTE — Telephone Encounter (Addendum)
Caller name:Eduardo Sanzone Relation to pt: patient Call back [phone] [id number] [place] [place]   Reason for call:  Patient called to request a refill for LORYNA 3-0.02 MG tablet

## 2014-09-25 ENCOUNTER — Encounter: Payer: Self-pay | Admitting: Medical

## 2014-09-25 ENCOUNTER — Ambulatory Visit (INDEPENDENT_AMBULATORY_CARE_PROVIDER_SITE_OTHER): Payer: Managed Care, Other (non HMO) | Admitting: Medical

## 2014-09-25 VITALS — BP 136/88 | HR 114 | Temp 98.7°F | Ht 65.75 in | Wt 258.0 lb

## 2014-09-25 DIAGNOSIS — R062 Wheezing: Secondary | ICD-10-CM | POA: Insufficient documentation

## 2014-09-25 DIAGNOSIS — J209 Acute bronchitis, unspecified: Secondary | ICD-10-CM | POA: Insufficient documentation

## 2014-09-25 DIAGNOSIS — R059 Cough, unspecified: Secondary | ICD-10-CM

## 2014-09-25 DIAGNOSIS — R05 Cough: Secondary | ICD-10-CM

## 2014-09-25 DIAGNOSIS — J0100 Acute maxillary sinusitis, unspecified: Secondary | ICD-10-CM | POA: Insufficient documentation

## 2014-09-25 MED ORDER — HYDROCODONE-HOMATROPINE 5-1.5 MG/5ML PO SYRP: 5.0000 mL | ORAL_SOLUTION | Freq: Three times a day (TID) | ORAL | Status: DC | PRN

## 2014-09-25 MED ORDER — CLARITHROMYCIN ER 500 MG PO TB24: 1000.0000 mg | ORAL_TABLET | Freq: Every day | ORAL | Status: DC

## 2014-09-25 MED ORDER — ALBUTEROL SULFATE HFA 108 (90 BASE) MCG/ACT IN AERS: 2.0000 | INHALATION_SPRAY | Freq: Four times a day (QID) | RESPIRATORY_TRACT | Status: DC | PRN

## 2014-09-25 MED ORDER — FLUTICASONE PROPIONATE 50 MCG/ACT NA SUSP: 2.0000 | Freq: Every day | NASAL | Status: DC

## 2014-09-25 NOTE — Assessment & Plan Note (Addendum)
Rx biaxin  For sinus infection. Rx of Fluticasone for her nasal congestion. Note also appears to have rt om. Antibiotic for this as well.

## 2014-09-25 NOTE — Progress Notes (Signed)
Subjective:    Patient ID: Monica Perkins, female    DOB: 08/03/1981, 33 y.o.   MRN: [id number]  HPI  Pt in stating chest congestion, productive cough, sinus presure and ear pain. This has been going on for 3-4 days. Pt states cough keeping her up at night. Tried cough syrup(Robitussion AC) prescription and did not help much at all. Pt getting off and on fevers and chills.   Pt has some wheezing. No asthma hx. She does not smoke.  Pt is on YAZ. Menses q 3 months.  Past Medical History  Diagnosis Date  . Arthritis     knees  . Hypertension   . Migraines   . Fatty liver     [place]  . Gallstones     [place]    History   Social History  . Marital Status: Single    Spouse Name: N/A    Number of Children: 0  . 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 Smoking status: Former Smoker    Quit date: 12/12/2011  . Smokeless tobacco: Never Used  . Alcohol Use: Yes     Comment: rarely   . Drug Use: No  . Sexual Activity: Not on file   Other Topics Concern  . Not on file   Social History Narrative   [place] partner    Past Surgical History  Procedure Laterality Date  . Tonsillectomy    . Knee arthroscopy  10-2010    B knee surgeries, several    Family History  Problem Relation Age of Onset  . Lung cancer    . Stroke Father   . Arthritis Father   . Cancer Father     lung  . Arthritis Mother   . Hyperlipidemia Mother   . Alcohol abuse Maternal Aunt   . Diabetes Maternal Aunt   . Arthritis Maternal Grandmother   . Heart disease Maternal Grandmother   . Hyperlipidemia Maternal Grandmother   . Hypertension Maternal Grandmother   . Arthritis Maternal Grandfather   . Heart disease Maternal Grandfather     Allergies  Allergen Reactions  . Cephalexin   . Penicillins     Current Outpatient Prescriptions on File Prior to Visit  Medication Sig Dispense Refill  . drospirenone-ethinyl estradiol (LORYNA)  3-0.02 MG tablet 1 TAB BY MOUTH DAILY  84 tablet  3  . escitalopram (LEXAPRO) 10 MG tablet TAKE 2 TABLETS (20 MG TOTAL) BY MOUTH DAILY.  180 tablet  1  . loratadine-pseudoephedrine (CLARITIN-D 24-HOUR) 10-240 MG per 24 hr tablet Take 1 tablet by mouth daily.      Marland Kitchen olmesartan-hydrochlorothiazide (BENICAR HCT) 20-12.5 MG per tablet Take 1 tablet by mouth daily.  30 tablet  0  . Pediatric Multiple Vit-C-FA (FLINSTONES GUMMIES OMEGA-3 DHA PO) Take 2 tablets by mouth daily.      . nitrofurantoin, macrocrystal-monohydrate, (MACROBID) 100 MG capsule Take 1 capsule (100 mg total) by mouth 2 (two) times daily. For 7 days.  14 capsule  0  . predniSONE (DELTASONE) 10 MG tablet 3 po qd for 3 days then 2 po qd for 3 days the 1 po qd for 3 days  18 tablet  0  . silver sulfADIAZINE (SILVADENE) 1 % cream Apply 1 application topically daily.  50 g  0   No current facility-administered medications on file prior to visit.    BP 136/88  Pulse 114  Temp(Src)  98.7 F (37.1 C) (Oral)  Ht 5' 5.75" (1.67 m)  Wt 258 lb (117.028 kg)  BMI 41.96 kg/m2  SpO2 98%  LMP 07/25/2014     Review of Systems  Constitutional: Positive for fever. Negative for chills and fatigue.       She is actually sweating some during exam.  HENT: Positive for congestion, rhinorrhea and sinus pressure. Negative for ear pain, sore throat and trouble swallowing.   Respiratory: Positive for cough. Negative for chest tightness and wheezing.        Some productive cough and occasional wheezing.  Cardiovascular: Negative for chest pain and palpitations.  Endocrine: Negative for polydipsia, polyphagia and polyuria.  Genitourinary: Negative.   Musculoskeletal: Negative for back pain, joint swelling and myalgias.       No leg pain. No diffuse myalgias.  Skin: Negative.   Neurological: Negative.   Hematological: Positive for adenopathy. Does not bruise/bleed easily.       Mild submandibular nodes swollen.       Objective:   Physical  Exam  General  Mental Status - Alert. General Appearance - Well groomed. Not in acute distress.  Skin Rashes- No Rashes. Some sweating of upper and face.  HEENT Head- Normal. Ear Auditory Canal - Left- Normal. Right - red.Tympanic Membrane- Left- Normal. Right- moderate red Eye Sclera/Conjunctiva- Left- Normal. Right- Normal. Nose & Sinuses Nasal Mucosa- Left- Boggy +  Congested. Right-  Boggy + Congested. Frontal and maxillary sinus pressure. Mouth & Throat Lips: Upper Lip- Normal: no dryness, cracking, pallor, cyanosis, or vesicular eruption. Lower Lip-Normal: no dryness, cracking, pallor, cyanosis or vesicular eruption. Buccal Mucosa- Bilateral- No Aphthous ulcers. Oropharynx- No Discharge or Erythema. Tonsils: Characteristics- Bilateral- No Erythema or Congestion. Size/Enlargement- Bilateral- !+  enlargement. Discharge- bilateral-None.  Neck Neck- Supple. No Masses. Mild shoddy faint tender submandibulare nodes. Good range of motion of the neck.   Chest and Lung Exam Auscultation: Breath Sounds:-Normal, clear even and unlabored.  Cardiovascular Auscultation:Rythm- Regular, rate and rythm  Murmurs & Other Heart Sounds:Ausculatation of the heart reveal- No Murmurs.  Lymphatic Head & Neck General Head & Neck Lymphatics: Bilateral: Description- No Localized lymphadenopathy.          Assessment & Plan:

## 2014-09-25 NOTE — Assessment & Plan Note (Signed)
Rx hycodan.

## 2014-09-25 NOTE — Assessment & Plan Note (Signed)
rx biaxin. For cough hycodan rx.( Note pt states has used before with biaxin and no issues.) Rx advisement was given.

## 2014-09-25 NOTE — Assessment & Plan Note (Signed)
Rx of albuterol if starts to wheeze. Note on exam no wheezing reported although she reports some rare and occasional.

## 2014-09-25 NOTE — Patient Instructions (Signed)
You appear to have sinus infection, bronchitis and rt om.  Stop robitussin AC otc and I am prescribing hydrocodone based syrup for your cough.  For your wheezing, I am prescribing albuterol.  For the sinus infection(as well as other infections) I am prescribing biaxin. Rx of fluticasone may help your congestion.  Follow up in 2 weeks or as needed.

## 2014-10-03 ENCOUNTER — Encounter: Payer: Self-pay | Admitting: Medical

## 2014-10-03 ENCOUNTER — Ambulatory Visit (HOSPITAL_BASED_OUTPATIENT_CLINIC_OR_DEPARTMENT_OTHER)
Admission: RE | Admit: 2014-10-03 | Discharge: 2014-10-03 | Disposition: A | Discharge status: Home / Self Care | Payer: Managed Care, Other (non HMO) | Source: Ambulatory Visit | Attending: Medical | Admitting: Medical

## 2014-10-03 ENCOUNTER — Ambulatory Visit (INDEPENDENT_AMBULATORY_CARE_PROVIDER_SITE_OTHER): Payer: Managed Care, Other (non HMO) | Admitting: Medical

## 2014-10-03 VITALS — BP 156/94 | HR 105 | Temp 98.5°F | Ht 65.75 in | Wt 255.4 lb

## 2014-10-03 DIAGNOSIS — R062 Wheezing: Secondary | ICD-10-CM

## 2014-10-03 DIAGNOSIS — R05 Cough: Secondary | ICD-10-CM | POA: Insufficient documentation

## 2014-10-03 DIAGNOSIS — H6690 Otitis media, unspecified, unspecified ear: Secondary | ICD-10-CM | POA: Insufficient documentation

## 2014-10-03 DIAGNOSIS — R059 Cough, unspecified: Secondary | ICD-10-CM

## 2014-10-03 DIAGNOSIS — J208 Acute bronchitis due to other specified organisms: Secondary | ICD-10-CM

## 2014-10-03 DIAGNOSIS — H6500 Acute serous otitis media, unspecified ear: Secondary | ICD-10-CM

## 2014-10-03 MED ORDER — HYDROCODONE-HOMATROPINE 5-1.5 MG/5ML PO SYRP: 5.0000 mL | ORAL_SOLUTION | Freq: Three times a day (TID) | ORAL | Status: DC | PRN

## 2014-10-03 MED ORDER — FLUCONAZOLE 150 MG PO TABS: 150.0000 mg | ORAL_TABLET | Freq: Once | ORAL | Status: DC

## 2014-10-03 MED ORDER — LEVOFLOXACIN 500 MG PO TABS: 500.0000 mg | ORAL_TABLET | Freq: Every day | ORAL | Status: DC

## 2014-10-03 MED ORDER — PREDNISONE 20 MG PO TABS: ORAL_TABLET | ORAL | Status: DC

## 2014-10-03 NOTE — Assessment & Plan Note (Signed)
Refill of hycodan today.

## 2014-10-03 NOTE — Assessment & Plan Note (Signed)
Rx of levofloxin. Pt allergic to pcn and cephasporins. Failed biaxin recently.

## 2014-10-03 NOTE — Progress Notes (Signed)
   Subjective:    Patient ID: Monica Perkins, female    DOB: 07-10-1981, 33 y.o.   MRN: [id number]  HPI  Pt in for laryngitis and productive cough. Pt ears are both hurting. Her sinuses feel a lot better but has more chest congestion. Pt still is wheezing now more at night. Pt is using hycodan for cough. She is almost of out of that. Pt having some fevers off and on.   Pt not a smoker. Pt took biaxin for 7 days but rx was for 10 days. Symptoms despite use of biaxin  Lm- August. On  yaz. Gets menses every 3 months.     Review of Systems  Constitutional: Positive for fever. Negative for chills and fatigue.       Subjective on and off fevers.  HENT: Positive for congestion, ear pain, sinus pressure and voice change. Negative for facial swelling, nosebleeds, postnasal drip, rhinorrhea, sneezing, sore throat and trouble swallowing.        Hoarse voice.  Respiratory: Positive for cough and wheezing. Negative for choking, chest tightness and shortness of breath.   Cardiovascular: Negative for chest pain and palpitations.  Gastrointestinal: Negative.   Genitourinary: Negative.   Musculoskeletal: Negative.  Negative for back pain.  Neurological: Negative.   Hematological: Negative for adenopathy. Does not bruise/bleed easily.  Psychiatric/Behavioral: Negative.        Objective:   Physical Exam General  Mental Status - Alert. General Appearance - Well groomed. Not in acute distress.  Skin Rashes- No Rashes.  HEENT Head- Normal. Ear Auditory Canal - Left- red.  Right - Normal.Tympanic Membrane- Left- Normal. Right- Normal. Eye Sclera/Conjunctiva- Left- Normal. Right- Normal. Nose & Sinuses Nasal Mucosa- Left-  Boggy + Congested. Right-  Boggy + Congested. No sinus pressure. Mouth & Throat Lips: Upper Lip- Normal: no dryness, cracking, pallor, cyanosis, or vesicular eruption. Lower Lip-Normal: no dryness, cracking, pallor, cyanosis or vesicular eruption. Buccal Mucosa- Bilateral- No  Aphthous ulcers. Oropharynx- No Discharge or Erythema. Tonsils: Characteristics- Bilateral- No Erythema or Congestion. Size/Enlargement- Bilateral- No enlargement. Discharge- bilateral-None.  Neck Neck- Supple. No Masses,    Chest and Lung Exam Auscultation: Breath Sounds:-Normal, mild shallow. Even and unlabored.   Cardiovascular Auscultation:Rythm- Regular.  Murmurs & Other Heart Sounds:Ausculatation of the heart reveal- No Murmurs.  Lymphatic Head & Neck General Head & Neck Lymphatics: Bilateral: Description- No Localized lymphadenopathy.  Neurologic CN III- XII grossly intact.         Assessment & Plan:

## 2014-10-03 NOTE — Patient Instructions (Signed)
For your  OM lt side and bronchitis, I am prescribing levofloxin antibiotic.(since you did not respond to biaxin and have allergy to pcn and cephalasporin). But also want to get cxr.   For your persistent cough and wheezing, we gave you depomedrol 40 mg im today and 3 days of oral prednisone. Continue the albuterol inhaler as needed.  Refilling your hycodan as well.  Follow up in 7 days or as needed

## 2014-10-03 NOTE — Assessment & Plan Note (Signed)
These symptoms are worsening particularly at night. So gave depo medrol 40 mg im and 3 days rx of prednisone. Pt has albuterol inhaler available as well.

## 2014-10-03 NOTE — Assessment & Plan Note (Addendum)
Symptoms persist. Will get cxr today and make sure no pneumonia. Did place her on levofloxin. Note pt failed biaxin and allergic to pcn as well as cephalosporins.

## 2014-10-12 ENCOUNTER — Other Ambulatory Visit: Payer: Self-pay

## 2014-10-12 ENCOUNTER — Telehealth: Payer: Self-pay | Admitting: Family Medicine

## 2014-10-12 DIAGNOSIS — L551 Sunburn of second degree: Secondary | ICD-10-CM

## 2014-10-12 MED ORDER — PREDNISONE 10 MG PO TABS: ORAL_TABLET | ORAL | Status: DC

## 2014-10-12 MED ORDER — PROMETHAZINE-DM 6.25-15 MG/5ML PO SYRP: ORAL_SOLUTION | ORAL | Status: DC

## 2014-10-12 NOTE — Telephone Encounter (Signed)
Pt has had 7 days of Biaxin and course of Levaquin.  This would treat all bacterial infections.  CXR was negative.  Suspect she now has a post-infectious cough which can last any where from 2-4 weeks. -Pt should take OTC Claritin or Zyrtec daily -drink plenty of fluids -mucinex DM for daytime cough -send script for Promethazine Dextromethorphan 1-2 tsps TID PRN, #256ml -Renew Prednisone prescription 10mg - 3 x3, 2x3, 1x3  If no improvement needs OV

## 2014-10-12 NOTE — Telephone Encounter (Signed)
Returned patients call. Reviewed orders with patient from Dr. Birdie Riddle. Sent prescriptions to pharmacy. Patient notified.

## 2014-10-12 NOTE — Telephone Encounter (Signed)
Caller name: Isabela  Call back [phone] [facility]acy[facility]  Reason for call:  Pt saw Percell Miller on 9/28 and 10/6 for cough, wheezing, sinuitis.  Edward instructed pt to call back if she was not better after she finished meds and we would give something stronger and some steroids.  Please advise.

## 2014-11-01 ENCOUNTER — Ambulatory Visit (INDEPENDENT_AMBULATORY_CARE_PROVIDER_SITE_OTHER): Payer: Managed Care, Other (non HMO) | Admitting: Family Medicine

## 2014-11-01 ENCOUNTER — Encounter: Payer: Self-pay | Admitting: Family Medicine

## 2014-11-01 VITALS — BP 136/80 | HR 110 | Temp 98.5°F | Resp 17 | Wt 254.1 lb

## 2014-11-01 DIAGNOSIS — R490 Dysphonia: Secondary | ICD-10-CM | POA: Insufficient documentation

## 2014-11-01 DIAGNOSIS — K219 Gastro-esophageal reflux disease without esophagitis: Secondary | ICD-10-CM | POA: Insufficient documentation

## 2014-11-01 DIAGNOSIS — R059 Cough, unspecified: Secondary | ICD-10-CM

## 2014-11-01 DIAGNOSIS — R05 Cough: Secondary | ICD-10-CM

## 2014-11-01 MED ORDER — PANTOPRAZOLE SODIUM 40 MG PO TBEC: 40.0000 mg | DELAYED_RELEASE_TABLET | Freq: Every day | ORAL | Status: DC

## 2014-11-01 MED ORDER — IPRATROPIUM-ALBUTEROL 0.5-2.5 (3) MG/3ML IN SOLN
3.0000 mL | Freq: Once | RESPIRATORY_TRACT | Status: AC
  Administered 2014-11-01: 3 mL via RESPIRATORY_TRACT

## 2014-11-01 MED ORDER — ALBUTEROL SULFATE HFA 108 (90 BASE) MCG/ACT IN AERS: 2.0000 | INHALATION_SPRAY | Freq: Four times a day (QID) | RESPIRATORY_TRACT | Status: DC | PRN

## 2014-11-01 NOTE — Assessment & Plan Note (Signed)
Ongoing for pt x1 month.  Has had 2 rounds of abx.  No copious PND.  Normal air movement and no wheezes/crackles on PE.  Suspect pt has GERD induced cough along w/ component of RAD.  Start inhaler prn.  PPI.  If no improvement, will refer to pulm.  Reviewed supportive care and red flags that should prompt return.  Pt expressed understanding and is in agreement w/ plan.

## 2014-11-01 NOTE — Assessment & Plan Note (Signed)
Ongoing x1 month.  Suspect combo of GERD and recent URI- in combination w/ chronic smoking.  Start PPI.  If no improvement, may need ENT evaluation.  Pt expressed understanding and is in agreement w/ plan.

## 2014-11-01 NOTE — Assessment & Plan Note (Signed)
New.  Pt w/ hx of similar but not currently being tx'd.  Start daily PPI.  Reviewed lifestyle and dietary modifications.  Will follow.

## 2014-11-01 NOTE — Patient Instructions (Signed)
Follow up as needed Start the Protonix daily to decrease reflux (this is likely contributing to both hoarseness and cough) Use the inhaler- 2 puffs every 4-6 hrs as needed for cough Drink plenty of fluids Mucinex DM twice daily to thin congestion and decrease cough If no improvement in the next 7-10 days, please call and we'll refer to pulmonary Call with any questions or concerns Hang in there!!!

## 2014-11-01 NOTE — Progress Notes (Signed)
Pre visit review using our clinic review tool, if applicable. No additional management support is needed unless otherwise documented below in the visit note. 

## 2014-11-01 NOTE — Progress Notes (Signed)
   Subjective:    Patient ID: Monica Perkins, female    DOB: 1981/10/04, 33 y.o.   MRN: [id number]  HPI URI- sxs started 9/30.  Was seen on 9/29 and tx'd w/ Biaxin.  No improvement.  Came back 10/6 and was treated w/ Levaquin and prednisone.  Called back on 10/15 and was given longer pred taper and cough syrup.  Still coughing- productive of white/green sputum.  Remains very hoarse.  Denies PND.  + GERD.  Denies sinus pain/pressure, ear pain, N/V.   Review of Systems For ROS see HPI     Objective:   Physical Exam  Constitutional: She appears well-developed and well-nourished. No distress.  HENT:  Head: Normocephalic and atraumatic.  TMs normal bilaterally Mild nasal congestion Throat w/out erythema, edema, or exudate  Eyes: Conjunctivae and EOM are normal. Pupils are equal, round, and reactive to light.  Neck: Normal range of motion. Neck supple.  Cardiovascular: Normal rate, regular rhythm, normal heart sounds and intact distal pulses.   No murmur heard. Pulmonary/Chest: Effort normal and breath sounds normal. No respiratory distress. She has no wheezes.  + hacking cough  Lymphadenopathy:    She has no cervical adenopathy.  Vitals reviewed.         Assessment & Plan:

## 2014-11-02 ENCOUNTER — Other Ambulatory Visit: Payer: Self-pay | Admitting: Family Medicine

## 2014-11-02 ENCOUNTER — Telehealth: Payer: Self-pay | Admitting: Family Medicine

## 2014-11-02 MED ORDER — OMEPRAZOLE 40 MG PO CPDR: 40.0000 mg | DELAYED_RELEASE_CAPSULE | Freq: Every day | ORAL | Status: DC

## 2014-11-02 NOTE — Telephone Encounter (Signed)
Caller name: Meghin Relation to pt: self Call back number: [phone] [facility] [address]  Reason for call:   Patient states that her pharmacy is telling her that her insurance requires a prior auth for protonix. She wants to know if there is something else that she can take until we can get protonix approved? Patient saw Dr. Birdie Riddle for this.

## 2014-11-02 NOTE — Telephone Encounter (Signed)
Pt states that the pharmacy informed her that the rx you sent in for her needs PA and she wants to confirm if you received the PA form that was faxed this morning.

## 2014-11-02 NOTE — Telephone Encounter (Signed)
Med filled, will notify pt.  

## 2014-11-02 NOTE — Telephone Encounter (Signed)
Ok for Omeprazole 40mg  daily

## 2014-11-02 NOTE — Telephone Encounter (Signed)
Please advise. This pt was originally given Protonix that insurance stated needed a PA. Omeprazole 40 was sent in instead. Pt is now stating that this needs a pa.

## 2014-11-02 NOTE — Telephone Encounter (Signed)
PA initiated through [employer]. Awaiting determination. JG//CMA

## 2014-11-03 ENCOUNTER — Telehealth: Payer: Self-pay | Admitting: *Deleted

## 2014-11-03 IMAGING — US US BIOPSY
1 series · 7 of 7 positions shown · non-contrast
Comparison: none

Clinical: Elevated liver enzymes, fatty liver disease.  Request for
random liver core biopsy

ULTRASOUND GUIDED CORE LIVER BIOPSY
Sedation:  5 mg IV Versed; 250 mcg IV Fentanyl
Total Moderate Sedation Time:  40 minutes.
An ultrasound guided liver biopsy was thoroughly discussed with the
patient and questions were answered.  The benefits, risks,
alternatives, and complications were also discussed.  The patient
understands and wishes to proceed with the procedure.  Written
consent was obtained.
Ultrasound of the liver was performed and an appropriate skin entry
site was determined.  Skin site was marked, prepped with Betadine,
and draped in the usual sterile fashion.  Local anesthesia was
provided with 1% Lidocaine.
A 17 gauge trocar needle was advanced under ultrasound guidance
into the liver.  A total of 2, 33mm,  coaxial 18 gauge core samples
were then obtained through the guide needle. The guide needle was
removed. Post procedure scans were obtained.
Complications:  None

[Series 1: us biopsy · 0.32mm/px · 7 of 7 slices shown]
[im 1/7]
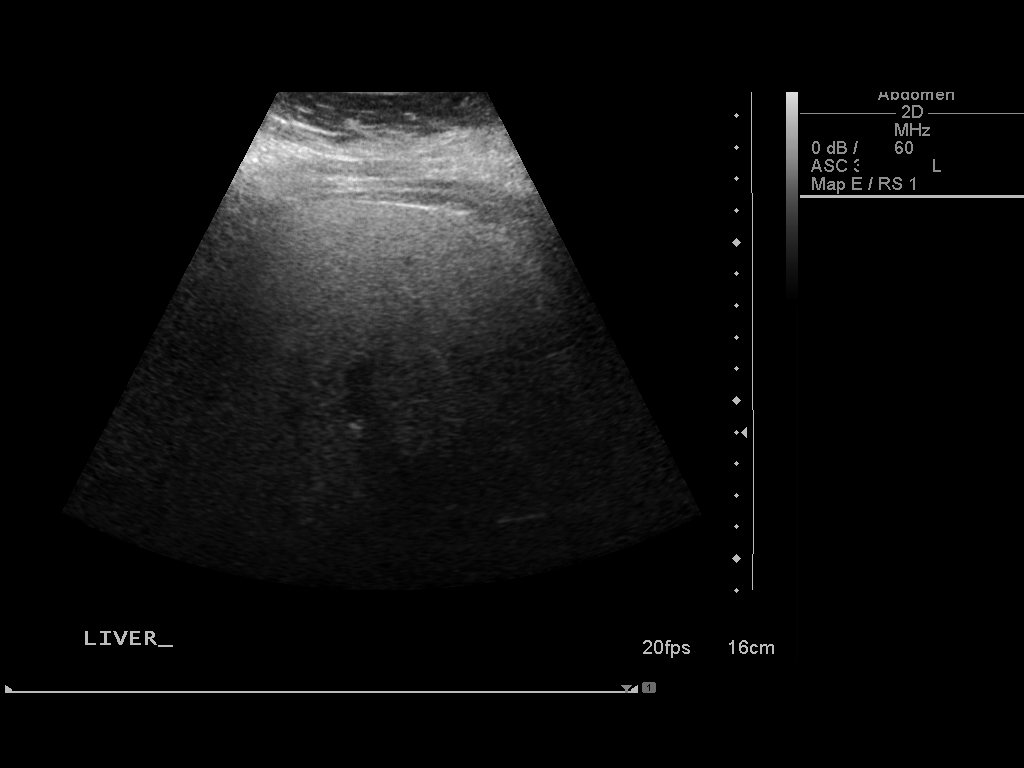
[im 2/7]
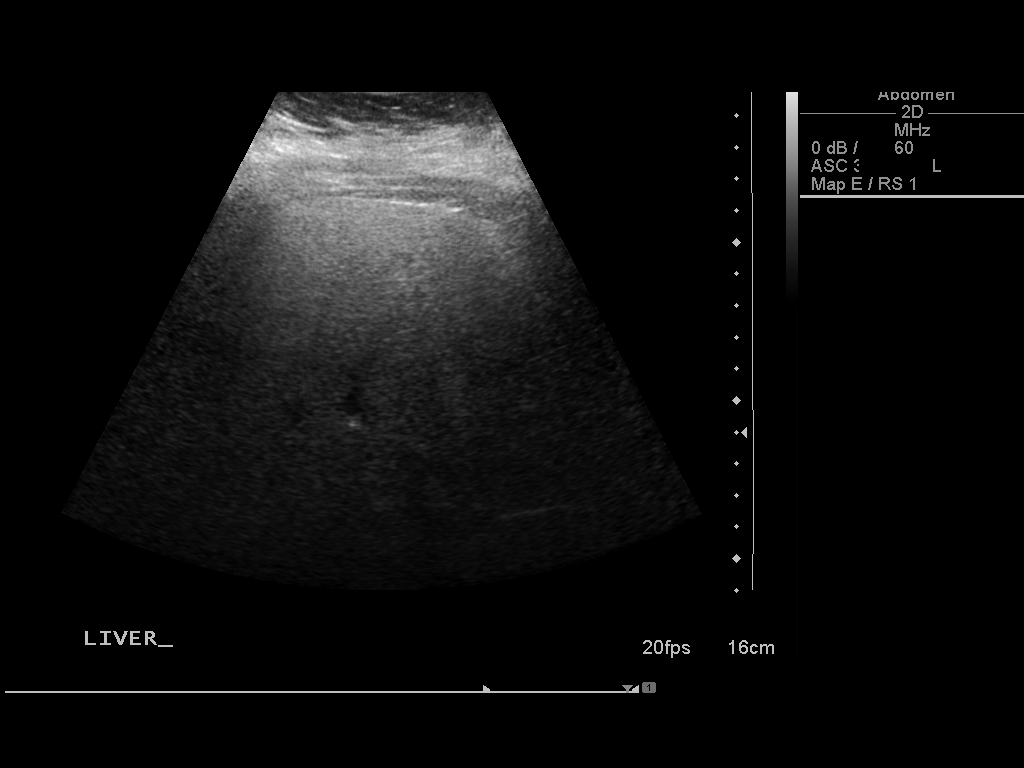
[im 3/7]
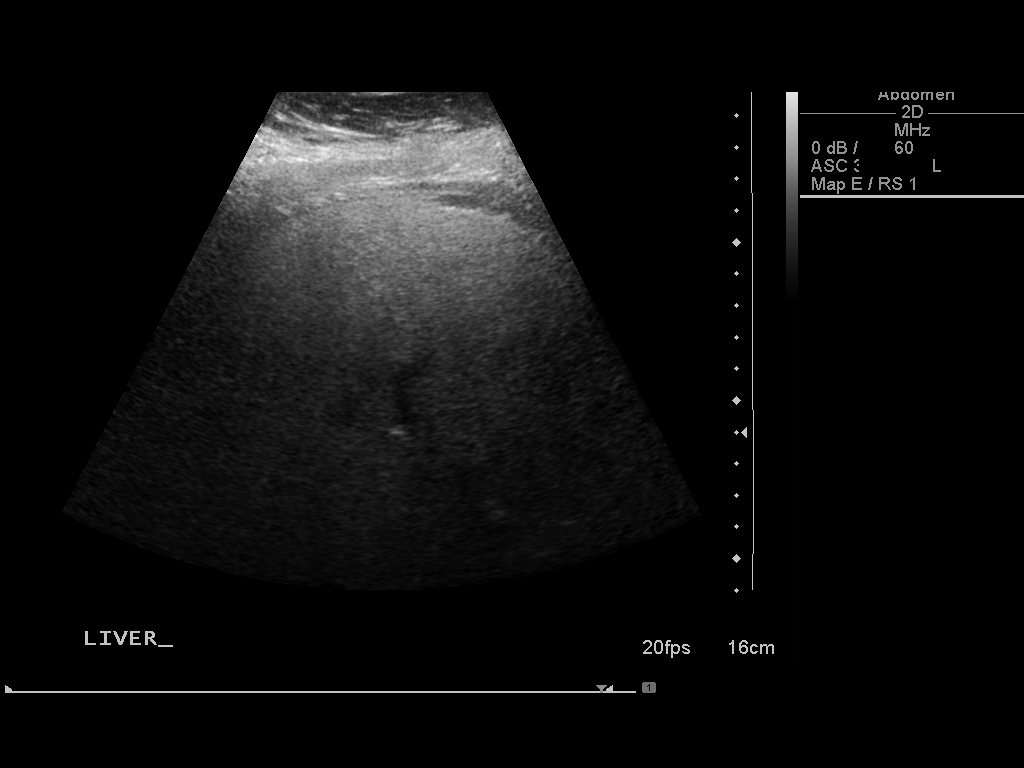
[im 4/7]
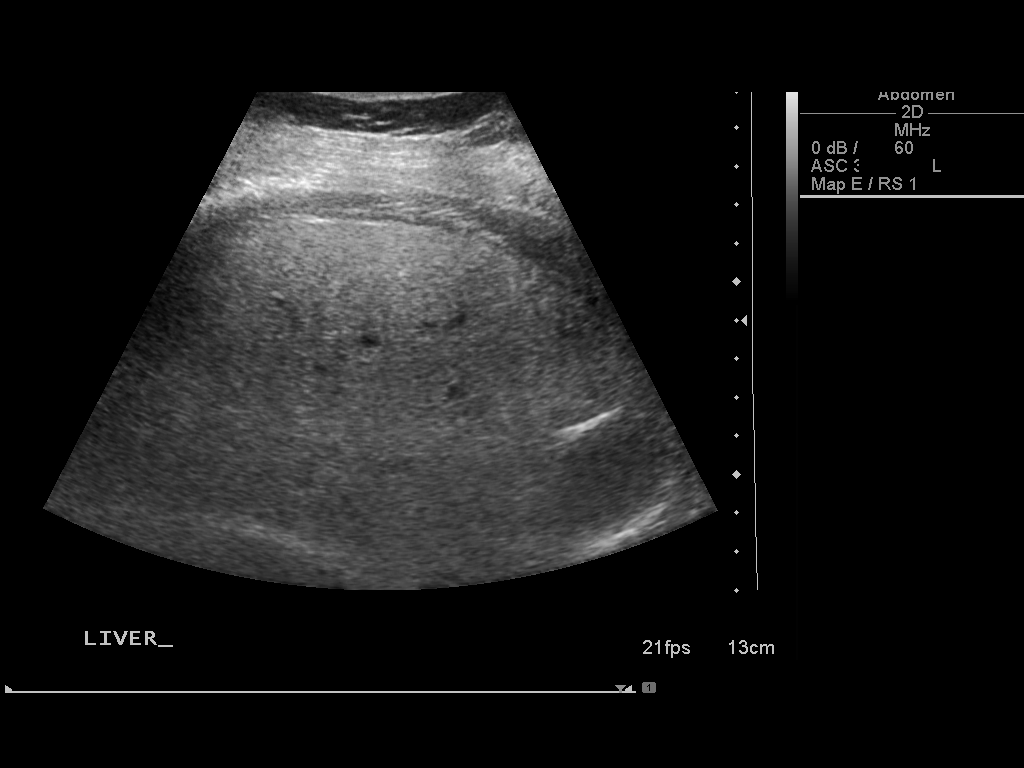
[im 5/7]
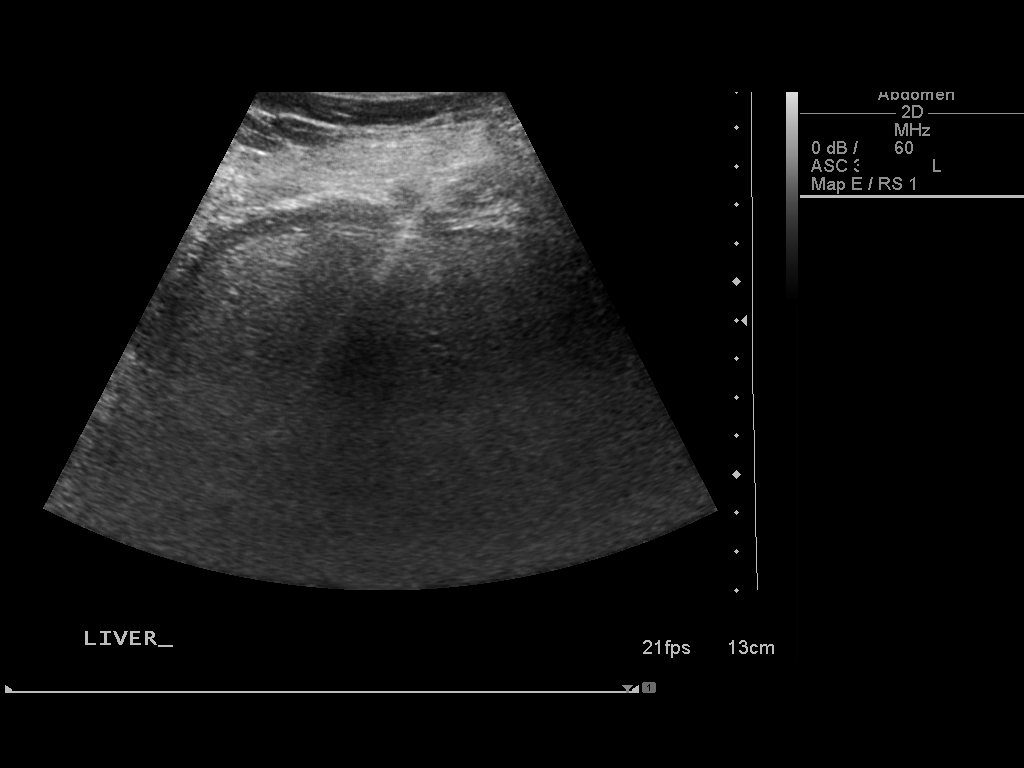
[im 6/7]
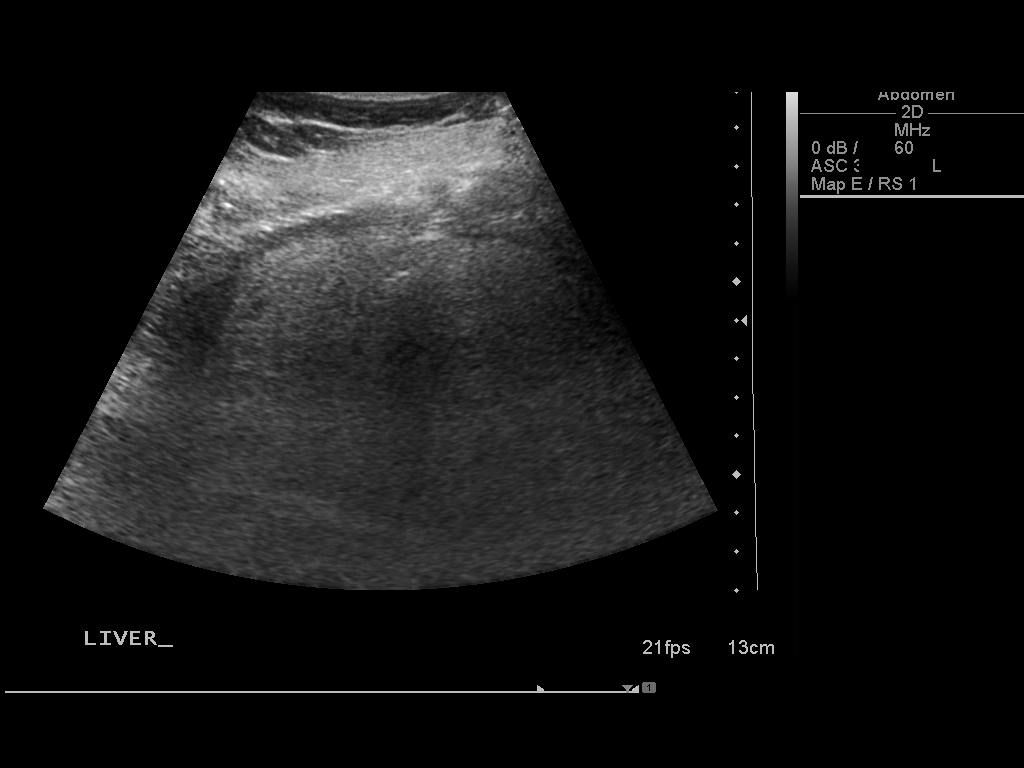
[im 7/7]
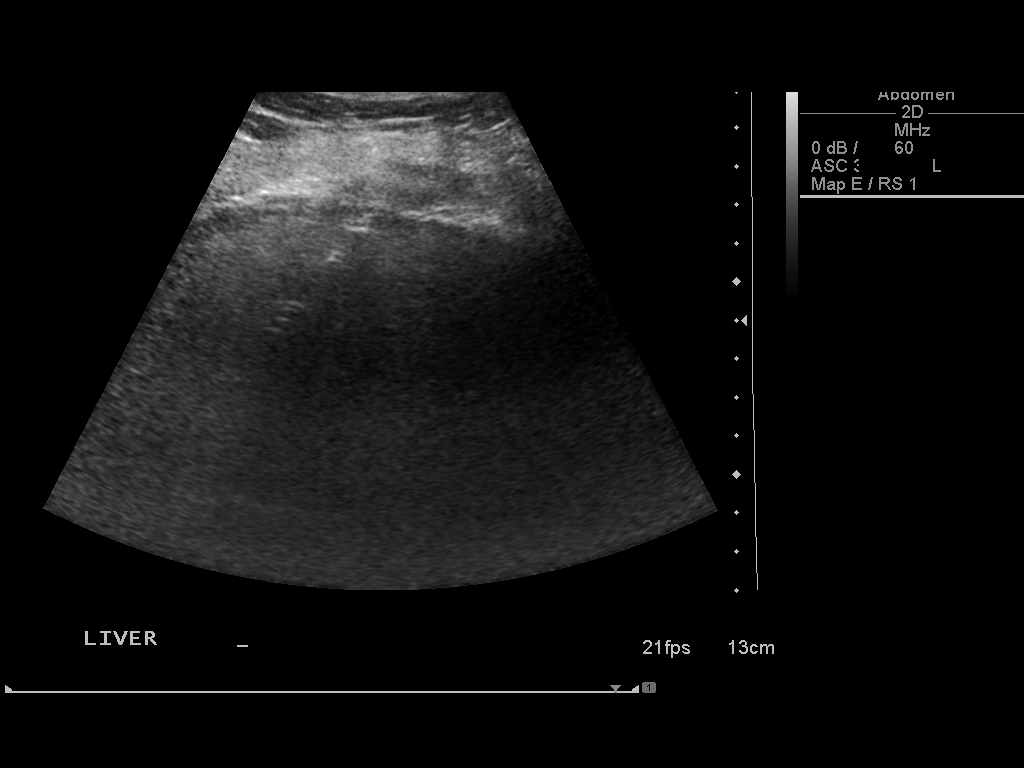

[7 of 7 positions shown; findings below may reference images not displayed]

IMPRESSION: Successful ultrasound guided random core biopsy of the liver.

Read by Sidd Tello PA_C

## 2014-11-03 NOTE — Telephone Encounter (Signed)
PA approved effective 11/02/2014-11/02/2017. JG//CMA

## 2014-11-03 NOTE — Telephone Encounter (Signed)
Rx refill request. Pt seen 1 month ago. Quite a long time to just refill diflucan. Prefer that she come in for evaluation.

## 2014-11-03 NOTE — Telephone Encounter (Signed)
error 

## 2014-11-06 ENCOUNTER — Other Ambulatory Visit: Payer: Self-pay

## 2014-11-06 NOTE — Telephone Encounter (Signed)
Left message on patients answering machine. Needs to be seen by provider for refill of Flucoazole.

## 2014-12-20 ENCOUNTER — Telehealth: Payer: Self-pay | Admitting: Family Medicine

## 2014-12-20 DIAGNOSIS — I10 Essential (primary) hypertension: Secondary | ICD-10-CM

## 2014-12-20 MED ORDER — OLMESARTAN MEDOXOMIL-HCTZ 20-12.5 MG PO TABS
1.0000 | ORAL_TABLET | Freq: Every day | ORAL | Status: DC
Start: 2014-12-20 — End: 2014-12-26

## 2014-12-20 NOTE — Telephone Encounter (Signed)
Caller name: Ethie Relation to pt: self Call back number: [phone] Pharmacy: [facility] on [address]  Reason for call:   Patient states that she didn't realize that she was out of benicar and would like a 30 day supply sent to her local pharmacy

## 2014-12-26 ENCOUNTER — Telehealth: Payer: Self-pay | Admitting: Family Medicine

## 2014-12-26 DIAGNOSIS — I10 Essential (primary) hypertension: Secondary | ICD-10-CM

## 2014-12-26 MED ORDER — OLMESARTAN MEDOXOMIL-HCTZ 20-12.5 MG PO TABS: 1.0000 | ORAL_TABLET | Freq: Every day | ORAL | Status: DC

## 2014-12-26 NOTE — Telephone Encounter (Signed)
Discussed with patient and she needed a 90 day supply nut she also said she was too early to get her med's refilled. I advised her to see if the pharmacy would allow her a few pills until the first when she could get the medication filled again because we do not sample, she voiced understanding, she said they gave her 5 already. She said she wanted a refill for 90 days sent. Rx faxed.     KP

## 2014-12-26 NOTE — Telephone Encounter (Signed)
Caller name: Halima, Fogal Relation to pt: self  Call back number: [phone] Pharmacy: [facility] [phone]  Reason for call:   Pt states insurance will not cover unless olmesartan-hydrochlorothiazide (BENICAR HCT) 20-12.5 MG per tablet is a 90 day supply. Pt is requesting a few pills (samples) to hold her over till January. Pt would like to discuss please advise.

## 2015-01-05 ENCOUNTER — Other Ambulatory Visit: Payer: Self-pay | Admitting: *Deleted

## 2015-01-05 MED ORDER — ESCITALOPRAM OXALATE 10 MG PO TABS: ORAL_TABLET | ORAL | Status: DC

## 2015-01-05 NOTE — Telephone Encounter (Signed)
Informed patient of this and she states that she will call on Monday to schedule appointment

## 2015-01-05 NOTE — Telephone Encounter (Signed)
Patient given 30 day supply with no refills of medications.  Patient due for office visit.  Please schedule.        EAL

## 2015-01-10 ENCOUNTER — Telehealth: Payer: Self-pay | Admitting: Family Medicine

## 2015-01-10 NOTE — Telephone Encounter (Signed)
Caller name: Othel, Dicostanzo Relation to pt: self  Call back number: [phone]   Reason for call:  Pt will be faxing over biometric form pt requesting form back by Feb 8th. Please fax to number indicated on form.

## 2015-01-16 NOTE — Telephone Encounter (Signed)
As of today, form as not been received

## 2015-02-20 ENCOUNTER — Ambulatory Visit (INDEPENDENT_AMBULATORY_CARE_PROVIDER_SITE_OTHER): Payer: BLUE CROSS/BLUE SHIELD | Admitting: Family Medicine

## 2015-02-20 ENCOUNTER — Encounter: Payer: Self-pay | Admitting: Family Medicine

## 2015-02-20 VITALS — BP 118/82 | HR 107 | Temp 98.6°F | Ht 66.0 in | Wt 250.0 lb

## 2015-02-20 DIAGNOSIS — F32A Depression, unspecified: Secondary | ICD-10-CM

## 2015-02-20 DIAGNOSIS — Z0000 Encounter for general adult medical examination without abnormal findings: Secondary | ICD-10-CM

## 2015-02-20 DIAGNOSIS — M25561 Pain in right knee: Secondary | ICD-10-CM

## 2015-02-20 DIAGNOSIS — I10 Essential (primary) hypertension: Secondary | ICD-10-CM

## 2015-02-20 DIAGNOSIS — F329 Major depressive disorder, single episode, unspecified: Secondary | ICD-10-CM

## 2015-02-20 DIAGNOSIS — R739 Hyperglycemia, unspecified: Secondary | ICD-10-CM

## 2015-02-20 DIAGNOSIS — M25562 Pain in left knee: Secondary | ICD-10-CM

## 2015-02-20 DIAGNOSIS — N946 Dysmenorrhea, unspecified: Secondary | ICD-10-CM

## 2015-02-20 DIAGNOSIS — K219 Gastro-esophageal reflux disease without esophagitis: Secondary | ICD-10-CM

## 2015-02-20 MED ORDER — DROSPIRENONE-ETHINYL ESTRADIOL 3-0.02 MG PO TABS: ORAL_TABLET | ORAL | Status: DC

## 2015-02-20 MED ORDER — MELOXICAM 15 MG PO TABS: ORAL_TABLET | ORAL | Status: DC

## 2015-02-20 MED ORDER — PANTOPRAZOLE SODIUM 40 MG PO TBEC: 40.0000 mg | DELAYED_RELEASE_TABLET | Freq: Every day | ORAL | Status: DC

## 2015-02-20 MED ORDER — OLMESARTAN MEDOXOMIL-HCTZ 20-12.5 MG PO TABS: 1.0000 | ORAL_TABLET | Freq: Every day | ORAL | Status: DC

## 2015-02-20 MED ORDER — ESCITALOPRAM OXALATE 10 MG PO TABS: ORAL_TABLET | ORAL | Status: DC

## 2015-02-20 NOTE — Progress Notes (Signed)
Pre visit review using our clinic review tool, if applicable. No additional management support is needed unless otherwise documented below in the visit note. 

## 2015-02-20 NOTE — Patient Instructions (Signed)
Preventive Care for Adults A healthy lifestyle and preventive care can promote health and wellness. Preventive health guidelines for women include the following key practices.  A routine yearly physical is a good way to check with your health care provider about your health and preventive screening. It is a chance to share any concerns and updates on your health and to receive a thorough exam.  Visit your dentist for a routine exam and preventive care every 6 months. Brush your teeth twice a day and floss once a day. Good oral hygiene prevents tooth decay and gum disease.  The frequency of eye exams is based on your age, health, family medical history, use of contact lenses, and other factors. Follow your health care provider's recommendations for frequency of eye exams.  Eat a healthy diet. Foods like vegetables, fruits, whole grains, low-fat dairy products, and lean protein foods contain the nutrients you need without too many calories. Decrease your intake of foods high in solid fats, added sugars, and salt. Eat the right amount of calories for you.Get information about a proper diet from your health care provider, if necessary.  Regular physical exercise is one of the most important things you can do for your health. Most adults should get at least 150 minutes of moderate-intensity exercise (any activity that increases your heart rate and causes you to sweat) each week. In addition, most adults need muscle-strengthening exercises on 2 or more days a week.  Maintain a healthy weight. The body mass index (BMI) is a screening tool to identify possible weight problems. It provides an estimate of body fat based on height and weight. Your health care provider can find your BMI and can help you achieve or maintain a healthy weight.For adults 20 years and older:  A BMI below 18.5 is considered underweight.  A BMI of 18.5 to 24.9 is normal.  A BMI of 25 to 29.9 is considered overweight.  A BMI of  30 and above is considered obese.  Maintain normal blood lipids and cholesterol levels by exercising and minimizing your intake of saturated fat. Eat a balanced diet with plenty of fruit and vegetables. Blood tests for lipids and cholesterol should begin at age 76 and be repeated every 5 years. If your lipid or cholesterol levels are high, you are over 50, or you are at high risk for heart disease, you may need your cholesterol levels checked more frequently.Ongoing high lipid and cholesterol levels should be treated with medicines if diet and exercise are not working.  If you smoke, find out from your health care provider how to quit. If you do not use tobacco, do not start.  Lung cancer screening is recommended for adults aged 22-80 years who are at high risk for developing lung cancer because of a history of smoking. A yearly low-dose CT scan of the lungs is recommended for people who have at least a 30-pack-year history of smoking and are a current smoker or have quit within the past 15 years. A pack year of smoking is smoking an average of 1 pack of cigarettes a day for 1 year (for example: 1 pack a day for 30 years or 2 packs a day for 15 years). Yearly screening should continue until the smoker has stopped smoking for at least 15 years. Yearly screening should be stopped for people who develop a health problem that would prevent them from having lung cancer treatment.  If you are pregnant, do not drink alcohol. If you are breastfeeding,  be very cautious about drinking alcohol. If you are not pregnant and choose to drink alcohol, do not have more than 1 drink per day. One drink is considered to be 12 ounces (355 mL) of beer, 5 ounces (148 mL) of wine, or 1.5 ounces (44 mL) of liquor.  Avoid use of street drugs. Do not share needles with anyone. Ask for help if you need support or instructions about stopping the use of drugs.  High blood pressure causes heart disease and increases the risk of  stroke. Your blood pressure should be checked at least every 1 to 2 years. Ongoing high blood pressure should be treated with medicines if weight loss and exercise do not work.  If you are 3-86 years old, ask your health care provider if you should take aspirin to prevent strokes.  Diabetes screening involves taking a blood sample to check your fasting blood sugar level. This should be done once every 3 years, after age 67, if you are within normal weight and without risk factors for diabetes. Testing should be considered at a younger age or be carried out more frequently if you are overweight and have at least 1 risk factor for diabetes.  Breast cancer screening is essential preventive care for women. You should practice "breast self-awareness." This means understanding the normal appearance and feel of your breasts and may include breast self-examination. Any changes detected, no matter how small, should be reported to a health care provider. Women in their 8s and 30s should have a clinical breast exam (CBE) by a health care provider as part of a regular health exam every 1 to 3 years. After age 70, women should have a CBE every year. Starting at age 25, women should consider having a mammogram (breast X-ray test) every year. Women who have a family history of breast cancer should talk to their health care provider about genetic screening. Women at a high risk of breast cancer should talk to their health care providers about having an MRI and a mammogram every year.  Breast cancer gene (BRCA)-related cancer risk assessment is recommended for women who have family members with BRCA-related cancers. BRCA-related cancers include breast, ovarian, tubal, and peritoneal cancers. Having family members with these cancers may be associated with an increased risk for harmful changes (mutations) in the breast cancer genes BRCA1 and BRCA2. Results of the assessment will determine the need for genetic counseling and  BRCA1 and BRCA2 testing.  Routine pelvic exams to screen for cancer are no longer recommended for nonpregnant women who are considered low risk for cancer of the pelvic organs (ovaries, uterus, and vagina) and who do not have symptoms. Ask your health care provider if a screening pelvic exam is right for you.  If you have had past treatment for cervical cancer or a condition that could lead to cancer, you need Pap tests and screening for cancer for at least 20 years after your treatment. If Pap tests have been discontinued, your risk factors (such as having a new sexual partner) need to be reassessed to determine if screening should be resumed. Some women have medical problems that increase the chance of getting cervical cancer. In these cases, your health care provider may recommend more frequent screening and Pap tests.  The HPV test is an additional test that may be used for cervical cancer screening. The HPV test looks for the virus that can cause the cell changes on the cervix. The cells collected during the Pap test can be  tested for HPV. The HPV test could be used to screen women aged 30 years and older, and should be used in women of any age who have unclear Pap test results. After the age of 30, women should have HPV testing at the same frequency as a Pap test.  Colorectal cancer can be detected and often prevented. Most routine colorectal cancer screening begins at the age of 50 years and continues through age 75 years. However, your health care provider may recommend screening at an earlier age if you have risk factors for colon cancer. On a yearly basis, your health care provider may provide home test kits to check for hidden blood in the stool. Use of a small camera at the end of a tube, to directly examine the colon (sigmoidoscopy or colonoscopy), can detect the earliest forms of colorectal cancer. Talk to your health care provider about this at age 50, when routine screening begins. Direct  exam of the colon should be repeated every 5-10 years through age 75 years, unless early forms of pre-cancerous polyps or small growths are found.  People who are at an increased risk for hepatitis B should be screened for this virus. You are considered at high risk for hepatitis B if:  You were born in a country where hepatitis B occurs often. Talk with your health care provider about which countries are considered high risk.  Your parents were born in a high-risk country and you have not received a shot to protect against hepatitis B (hepatitis B vaccine).  You have HIV or AIDS.  You use needles to inject street drugs.  You live with, or have sex with, someone who has hepatitis B.  You get hemodialysis treatment.  You take certain medicines for conditions like cancer, organ transplantation, and autoimmune conditions.  Hepatitis C blood testing is recommended for all people born from 1945 through 1965 and any individual with known risks for hepatitis C.  Practice safe sex. Use condoms and avoid high-risk sexual practices to reduce the spread of sexually transmitted infections (STIs). STIs include gonorrhea, chlamydia, syphilis, trichomonas, herpes, HPV, and human immunodeficiency virus (HIV). Herpes, HIV, and HPV are viral illnesses that have no cure. They can result in disability, cancer, and death.  You should be screened for sexually transmitted illnesses (STIs) including gonorrhea and chlamydia if:  You are sexually active and are younger than 24 years.  You are older than 24 years and your health care provider tells you that you are at risk for this type of infection.  Your sexual activity has changed since you were last screened and you are at an increased risk for chlamydia or gonorrhea. Ask your health care provider if you are at risk.  If you are at risk of being infected with HIV, it is recommended that you take a prescription medicine daily to prevent HIV infection. This is  called preexposure prophylaxis (PrEP). You are considered at risk if:  You are a heterosexual woman, are sexually active, and are at increased risk for HIV infection.  You take drugs by injection.  You are sexually active with a partner who has HIV.  Talk with your health care provider about whether you are at high risk of being infected with HIV. If you choose to begin PrEP, you should first be tested for HIV. You should then be tested every 3 months for as long as you are taking PrEP.  Osteoporosis is a disease in which the bones lose minerals and strength   with aging. This can result in serious bone fractures or breaks. The risk of osteoporosis can be identified using a bone density scan. Women ages 65 years and over and women at risk for fractures or osteoporosis should discuss screening with their health care providers. Ask your health care provider whether you should take a calcium supplement or vitamin D to reduce the rate of osteoporosis.  Menopause can be associated with physical symptoms and risks. Hormone replacement therapy is available to decrease symptoms and risks. You should talk to your health care provider about whether hormone replacement therapy is right for you.  Use sunscreen. Apply sunscreen liberally and repeatedly throughout the day. You should seek shade when your shadow is shorter than you. Protect yourself by wearing long sleeves, pants, a wide-brimmed hat, and sunglasses year round, whenever you are outdoors.  Once a month, do a whole body skin exam, using a mirror to look at the skin on your back. Tell your health care provider of new moles, moles that have irregular borders, moles that are larger than a pencil eraser, or moles that have changed in shape or color.  Stay current with required vaccines (immunizations).  Influenza vaccine. All adults should be immunized every year.  Tetanus, diphtheria, and acellular pertussis (Td, Tdap) vaccine. Pregnant women should  receive 1 dose of Tdap vaccine during each pregnancy. The dose should be obtained regardless of the length of time since the last dose. Immunization is preferred during the 27th-36th week of gestation. An adult who has not previously received Tdap or who does not know her vaccine status should receive 1 dose of Tdap. This initial dose should be followed by tetanus and diphtheria toxoids (Td) booster doses every 10 years. Adults with an unknown or incomplete history of completing a 3-dose immunization series with Td-containing vaccines should begin or complete a primary immunization series including a Tdap dose. Adults should receive a Td booster every 10 years.  Varicella vaccine. An adult without evidence of immunity to varicella should receive 2 doses or a second dose if she has previously received 1 dose. Pregnant females who do not have evidence of immunity should receive the first dose after pregnancy. This first dose should be obtained before leaving the health care facility. The second dose should be obtained 4-8 weeks after the first dose.  Human papillomavirus (HPV) vaccine. Females aged 13-26 years who have not received the vaccine previously should obtain the 3-dose series. The vaccine is not recommended for use in pregnant females. However, pregnancy testing is not needed before receiving a dose. If a female is found to be pregnant after receiving a dose, no treatment is needed. In that case, the remaining doses should be delayed until after the pregnancy. Immunization is recommended for any person with an immunocompromised condition through the age of 26 years if she did not get any or all doses earlier. During the 3-dose series, the second dose should be obtained 4-8 weeks after the first dose. The third dose should be obtained 24 weeks after the first dose and 16 weeks after the second dose.  Zoster vaccine. One dose is recommended for adults aged 60 years or older unless certain conditions are  present.  Measles, mumps, and rubella (MMR) vaccine. Adults born before 1957 generally are considered immune to measles and mumps. Adults born in 1957 or later should have 1 or more doses of MMR vaccine unless there is a contraindication to the vaccine or there is laboratory evidence of immunity to   each of the three diseases. A routine second dose of MMR vaccine should be obtained at least 28 days after the first dose for students attending postsecondary schools, health care workers, or international travelers. People who received inactivated measles vaccine or an unknown type of measles vaccine during 1963-1967 should receive 2 doses of MMR vaccine. People who received inactivated mumps vaccine or an unknown type of mumps vaccine before 1979 and are at high risk for mumps infection should consider immunization with 2 doses of MMR vaccine. For females of childbearing age, rubella immunity should be determined. If there is no evidence of immunity, females who are not pregnant should be vaccinated. If there is no evidence of immunity, females who are pregnant should delay immunization until after pregnancy. Unvaccinated health care workers born before 1957 who lack laboratory evidence of measles, mumps, or rubella immunity or laboratory confirmation of disease should consider measles and mumps immunization with 2 doses of MMR vaccine or rubella immunization with 1 dose of MMR vaccine.  Pneumococcal 13-valent conjugate (PCV13) vaccine. When indicated, a person who is uncertain of her immunization history and has no record of immunization should receive the PCV13 vaccine. An adult aged 19 years or older who has certain medical conditions and has not been previously immunized should receive 1 dose of PCV13 vaccine. This PCV13 should be followed with a dose of pneumococcal polysaccharide (PPSV23) vaccine. The PPSV23 vaccine dose should be obtained at least 8 weeks after the dose of PCV13 vaccine. An adult aged 19  years or older who has certain medical conditions and previously received 1 or more doses of PPSV23 vaccine should receive 1 dose of PCV13. The PCV13 vaccine dose should be obtained 1 or more years after the last PPSV23 vaccine dose.  Pneumococcal polysaccharide (PPSV23) vaccine. When PCV13 is also indicated, PCV13 should be obtained first. All adults aged 65 years and older should be immunized. An adult younger than age 65 years who has certain medical conditions should be immunized. Any person who resides in a nursing home or long-term care facility should be immunized. An adult smoker should be immunized. People with an immunocompromised condition and certain other conditions should receive both PCV13 and PPSV23 vaccines. People with human immunodeficiency virus (HIV) infection should be immunized as soon as possible after diagnosis. Immunization during chemotherapy or radiation therapy should be avoided. Routine use of PPSV23 vaccine is not recommended for American Indians, [place] Natives, or people younger than 65 years unless there are medical conditions that require PPSV23 vaccine. When indicated, people who have unknown immunization and have no record of immunization should receive PPSV23 vaccine. One-time revaccination 5 years after the first dose of PPSV23 is recommended for people aged 19-64 years who have chronic kidney failure, nephrotic syndrome, asplenia, or immunocompromised conditions. People who received 1-2 doses of PPSV23 before age 65 years should receive another dose of PPSV23 vaccine at age 65 years or later if at least 5 years have passed since the previous dose. Doses of PPSV23 are not needed for people immunized with PPSV23 at or after age 65 years.  Meningococcal vaccine. Adults with asplenia or persistent complement component deficiencies should receive 2 doses of quadrivalent meningococcal conjugate (MenACWY-D) vaccine. The doses should be obtained at least 2 months apart.  Microbiologists working with certain meningococcal bacteria, military recruits, people at risk during an outbreak, and people who travel to or live in countries with a high rate of meningitis should be immunized. A first-year college student up through age   21 years who is living in a residence hall should receive a dose if she did not receive a dose on or after her 16th birthday. Adults who have certain high-risk conditions should receive one or more doses of vaccine.  Hepatitis A vaccine. Adults who wish to be protected from this disease, have certain high-risk conditions, work with hepatitis A-infected animals, work in hepatitis A research labs, or travel to or work in countries with a high rate of hepatitis A should be immunized. Adults who were previously unvaccinated and who anticipate close contact with an international adoptee during the first 60 days after arrival in the [place] from a country with a high rate of hepatitis A should be immunized.  Hepatitis B vaccine. Adults who wish to be protected from this disease, have certain high-risk conditions, may be exposed to blood or other infectious body fluids, are household contacts or sex partners of hepatitis B positive people, are clients or workers in certain care facilities, or travel to or work in countries with a high rate of hepatitis B should be immunized.  Haemophilus influenzae type b (Hib) vaccine. A previously unvaccinated person with asplenia or sickle cell disease or having a scheduled splenectomy should receive 1 dose of Hib vaccine. Regardless of previous immunization, a recipient of a hematopoietic stem cell transplant should receive a 3-dose series 6-12 months after her successful transplant. Hib vaccine is not recommended for adults with HIV infection. Preventive Services / Frequency Ages 86 to 44 years  Blood pressure check.** / Every 1 to 2 years.  Lipid and cholesterol check.** / Every 5 years beginning at age  13.  Clinical breast exam.** / Every 3 years for women in their 68s and 27s.  BRCA-related cancer risk assessment.** / For women who have family members with a BRCA-related cancer (breast, ovarian, tubal, or peritoneal cancers).  Pap test.** / Every 2 years from ages 53 through 43. Every 3 years starting at age 47 through age 4 or 46 with a history of 3 consecutive normal Pap tests.  HPV screening.** / Every 3 years from ages 52 through ages 23 to 49 with a history of 3 consecutive normal Pap tests.  Hepatitis C blood test.** / For any individual with known risks for hepatitis C.  Skin self-exam. / Monthly.  Influenza vaccine. / Every year.  Tetanus, diphtheria, and acellular pertussis (Tdap, Td) vaccine.** / Consult your health care provider. Pregnant women should receive 1 dose of Tdap vaccine during each pregnancy. 1 dose of Td every 10 years.  Varicella vaccine.** / Consult your health care provider. Pregnant females who do not have evidence of immunity should receive the first dose after pregnancy.  HPV vaccine. / 3 doses over 6 months, if 103 and younger. The vaccine is not recommended for use in pregnant females. However, pregnancy testing is not needed before receiving a dose.  Measles, mumps, rubella (MMR) vaccine.** / You need at least 1 dose of MMR if you were born in 1957 or later. You may also need a 2nd dose. For females of childbearing age, rubella immunity should be determined. If there is no evidence of immunity, females who are not pregnant should be vaccinated. If there is no evidence of immunity, females who are pregnant should delay immunization until after pregnancy.  Pneumococcal 13-valent conjugate (PCV13) vaccine.** / Consult your health care provider.  Pneumococcal polysaccharide (PPSV23) vaccine.** / 1 to 2 doses if you smoke cigarettes or if you have certain conditions.  Meningococcal vaccine.** /  1 dose if you are age 19 to 21 years and a first-year college  student living in a residence hall, or have one of several medical conditions, you need to get vaccinated against meningococcal disease. You may also need additional booster doses.  Hepatitis A vaccine.** / Consult your health care provider.  Hepatitis B vaccine.** / Consult your health care provider.  Haemophilus influenzae type b (Hib) vaccine.** / Consult your health care provider. Ages 40 to 64 years  Blood pressure check.** / Every 1 to 2 years.  Lipid and cholesterol check.** / Every 5 years beginning at age 20 years.  Lung cancer screening. / Every year if you are aged 55-80 years and have a 30-pack-year history of smoking and currently smoke or have quit within the past 15 years. Yearly screening is stopped once you have quit smoking for at least 15 years or develop a health problem that would prevent you from having lung cancer treatment.  Clinical breast exam.** / Every year after age 40 years.  BRCA-related cancer risk assessment.** / For women who have family members with a BRCA-related cancer (breast, ovarian, tubal, or peritoneal cancers).  Mammogram.** / Every year beginning at age 40 years and continuing for as long as you are in good health. Consult with your health care provider.  Pap test.** / Every 3 years starting at age 30 years through age 65 or 70 years with a history of 3 consecutive normal Pap tests.  HPV screening.** / Every 3 years from ages 30 years through ages 65 to 70 years with a history of 3 consecutive normal Pap tests.  Fecal occult blood test (FOBT) of stool. / Every year beginning at age 50 years and continuing until age 75 years. You may not need to do this test if you get a colonoscopy every 10 years.  Flexible sigmoidoscopy or colonoscopy.** / Every 5 years for a flexible sigmoidoscopy or every 10 years for a colonoscopy beginning at age 50 years and continuing until age 75 years.  Hepatitis C blood test.** / For all people born from 1945 through  1965 and any individual with known risks for hepatitis C.  Skin self-exam. / Monthly.  Influenza vaccine. / Every year.  Tetanus, diphtheria, and acellular pertussis (Tdap/Td) vaccine.** / Consult your health care provider. Pregnant women should receive 1 dose of Tdap vaccine during each pregnancy. 1 dose of Td every 10 years.  Varicella vaccine.** / Consult your health care provider. Pregnant females who do not have evidence of immunity should receive the first dose after pregnancy.  Zoster vaccine.** / 1 dose for adults aged 60 years or older.  Measles, mumps, rubella (MMR) vaccine.** / You need at least 1 dose of MMR if you were born in 1957 or later. You may also need a 2nd dose. For females of childbearing age, rubella immunity should be determined. If there is no evidence of immunity, females who are not pregnant should be vaccinated. If there is no evidence of immunity, females who are pregnant should delay immunization until after pregnancy.  Pneumococcal 13-valent conjugate (PCV13) vaccine.** / Consult your health care provider.  Pneumococcal polysaccharide (PPSV23) vaccine.** / 1 to 2 doses if you smoke cigarettes or if you have certain conditions.  Meningococcal vaccine.** / Consult your health care provider.  Hepatitis A vaccine.** / Consult your health care provider.  Hepatitis B vaccine.** / Consult your health care provider.  Haemophilus influenzae type b (Hib) vaccine.** / Consult your health care provider. Ages 65   years and over  Blood pressure check.** / Every 1 to 2 years.  Lipid and cholesterol check.** / Every 5 years beginning at age 40 years.  Lung cancer screening. / Every year if you are aged 53-80 years and have a 30-pack-year history of smoking and currently smoke or have quit within the past 15 years. Yearly screening is stopped once you have quit smoking for at least 15 years or develop a health problem that would prevent you from having lung cancer  treatment.  Clinical breast exam.** / Every year after age 51 years.  BRCA-related cancer risk assessment.** / For women who have family members with a BRCA-related cancer (breast, ovarian, tubal, or peritoneal cancers).  Mammogram.** / Every year beginning at age 21 years and continuing for as long as you are in good health. Consult with your health care provider.  Pap test.** / Every 3 years starting at age 39 years through age 64 or 10 years with 3 consecutive normal Pap tests. Testing can be stopped between 65 and 70 years with 3 consecutive normal Pap tests and no abnormal Pap or HPV tests in the past 10 years.  HPV screening.** / Every 3 years from ages 24 years through ages 56 or 64 years with a history of 3 consecutive normal Pap tests. Testing can be stopped between 65 and 70 years with 3 consecutive normal Pap tests and no abnormal Pap or HPV tests in the past 10 years.  Fecal occult blood test (FOBT) of stool. / Every year beginning at age 95 years and continuing until age 84 years. You may not need to do this test if you get a colonoscopy every 10 years.  Flexible sigmoidoscopy or colonoscopy.** / Every 5 years for a flexible sigmoidoscopy or every 10 years for a colonoscopy beginning at age 37 years and continuing until age 31 years.  Hepatitis C blood test.** / For all people born from [birth year] through 1963 and any individual with known risks for hepatitis C.  Osteoporosis screening.** / A one-time screening for women ages 96 years and over and women at risk for fractures or osteoporosis.  Skin self-exam. / Monthly.  Influenza vaccine. / Every year.  Tetanus, diphtheria, and acellular pertussis (Tdap/Td) vaccine.** / 1 dose of Td every 10 years.  Varicella vaccine.** / Consult your health care provider.  Zoster vaccine.** / 1 dose for adults aged 40 years or older.  Pneumococcal 13-valent conjugate (PCV13) vaccine.** / Consult your health care provider.  Pneumococcal  polysaccharide (PPSV23) vaccine.** / 1 dose for all adults aged 77 years and older.  Meningococcal vaccine.** / Consult your health care provider.  Hepatitis A vaccine.** / Consult your health care provider.  Hepatitis B vaccine.** / Consult your health care provider.  Haemophilus influenzae type b (Hib) vaccine.** / Consult your health care provider. ** Family history and personal history of risk and conditions may change your health care provider's recommendations. Document Released: 02/10/2002 Document Revised: 05/01/2014 Document Reviewed: 05/12/2011 [facility] Patient Information 2015 [place], [place]. This information is not intended to replace advice given to you by your health care provider. Make sure you discuss any questions you have with your health care provider.

## 2015-02-20 NOTE — Progress Notes (Signed)
Subjective:     Monica Perkins is a 34 y.o. female and is here for a comprehensive physical exam. The patient reports no problems.  History   Social History  . Marital Status: Single    Spouse Name: N/A  . Number of Children: 0  . 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 Smoking status: Former Smoker    Quit date: 12/12/2011  . Smokeless tobacco: Never Used  . Alcohol Use: Yes     Comment: rarely   . Drug Use: No  . Sexual Activity: Not on file   Other Topics Concern  . Not on file   Social History Narrative   [place] partner   Health Maintenance  Topic Date Due  . INFLUENZA VACCINE  02/21/2016 (Originally 07/29/2014)  . PAP SMEAR  03/17/2016  . TETANUS/TDAP  04/10/2024    The following portions of the patient's history were reviewed and updated as appropriate:  She  has a past medical history of Arthritis; Hypertension; Migraines; Fatty liver; and Gallstones. She  does not have any pertinent problems on file. She  has past surgical history that includes Tonsillectomy and Knee arthroscopy (10-2010). Her family history includes Alcohol abuse in her maternal aunt; Arthritis in her father, maternal grandfather, maternal grandmother, and mother; Cancer in her father; Diabetes in her maternal aunt; Heart disease in her maternal grandfather and maternal grandmother; Hyperlipidemia in her maternal grandmother and mother; Hypertension in her maternal grandmother; Lung cancer in an other family member; Stroke in her father. She  reports that she quit smoking about 3 years ago. She has never used smokeless tobacco. She reports that she drinks alcohol. She reports that she does not use illicit drugs. She has a current medication list which includes the following prescription(s): drospirenone-ethinyl estradiol, escitalopram, loratadine-pseudoephedrine, olmesartan-hydrochlorothiazide, pantoprazole, pediatric multiple  vit-c-fa, and meloxicam. Current Outpatient Prescriptions on File Prior to Visit  Medication Sig Dispense Refill  . drospirenone-ethinyl estradiol (LORYNA) 3-0.02 MG tablet 1 TAB BY MOUTH DAILY 84 tablet 3  . escitalopram (LEXAPRO) 10 MG tablet TAKE 2 TABLETS (20 MG TOTAL) BY MOUTH DAILY. 60 tablet 0  . loratadine-pseudoephedrine (CLARITIN-D 24-HOUR) 10-240 MG per 24 hr tablet Take 1 tablet by mouth daily.    Marland Kitchen olmesartan-hydrochlorothiazide (BENICAR HCT) 20-12.5 MG per tablet Take 1 tablet by mouth daily. 90 tablet 0  . Pediatric Multiple Vit-C-FA (FLINSTONES GUMMIES OMEGA-3 DHA PO) Take 2 tablets by mouth daily.     No current facility-administered medications on file prior to visit.   She is allergic to cephalexin and penicillins..  Review of Systems Review of Systems  Constitutional: Negative for activity change, appetite change and fatigue.  HENT: Negative for hearing loss, congestion, tinnitus and ear discharge.  dentist q33m----+ ear ache Eyes: Negative for visual disturbance (see optho q1y -- vision corrected to 20/20 with glasses).  Respiratory: Negative for cough, chest tightness and shortness of breath.   Cardiovascular: Negative for chest pain, palpitations and leg swelling.  Gastrointestinal: Negative for abdominal pain, diarrhea, constipation and abdominal distention.  Genitourinary: Negative for urgency, frequency, decreased urine volume and difficulty urinating.  Musculoskeletal: Negative for back pain, + knee pain b/l Skin: Negative for color change, pallor and rash.  Neurological: Negative for dizziness, light-headedness, numbness and headaches.  Hematological: Negative for adenopathy. Does not bruise/bleed easily.  Psychiatric/Behavioral: Negative for suicidal ideas, confusion, sleep disturbance, self-injury, dysphoric mood, decreased concentration and agitation.  Objective:    BP 118/82 mmHg  Pulse 107  Temp(Src) 98.6 F (37 C) (Oral)  Ht 5\' 6"  (1.676 m)   Wt 250 lb (113.399 kg)  BMI 40.37 kg/m2  SpO2 97%  LMP 12/25/2014 General appearance: alert, cooperative, appears stated age and no distress Head: Normocephalic, without obvious abnormality, atraumatic Eyes: conjunctivae/corneas clear. PERRL, EOM's intact. Fundi benign. Ears: normal TM's and external ear canals both ears Nose: Nares normal. Septum midline. Mucosa normal. No drainage or sinus tenderness. Throat: lips, mucosa, and tongue normal; teeth and gums normal Neck: no adenopathy, no carotid bruit, no JVD, supple, symmetrical, trachea midline and thyroid not enlarged, symmetric, no tenderness/mass/nodules Back: symmetric, no curvature. ROM normal. No CVA tenderness. Lungs: clear to auscultation bilaterally Breasts: normal appearance, no masses or tenderness Heart: regular rate and rhythm, S1, S2 normal, no murmur, click, rub or gallop Abdomen: soft, non-tender; bowel sounds normal; no masses,  no organomegaly Pelvic: deferred Extremities: extremities normal, atraumatic, no cyanosis or edema Pulses: 2+ and symmetric Skin: Skin color, texture, turgor normal. No rashes or lesions Lymph nodes: Cervical, supraclavicular, and axillary nodes normal. Neurologic: Alert and oriented X 3, normal strength and tone. Normal symmetric reflexes. Normal coordination and gait Psych- no depression, no anxiety       Assessment:    Healthy female exam.    Plan:     ghm utd Check labs See After Visit Summary for Counseling Recommendations    1. Knee pain, bilateral   - meloxicam (MOBIC) 15 MG tablet; 1/2-1 po qd prn knee pain  Dispense: 30 tablet; Refill: 2  2. Preventative health care  - Basic metabolic panel; Future - CBC with Differential/Platelet; Future - Hepatic function panel; Future - Lipid panel; Future - POCT urinalysis dipstick; Future - TSH; Future  3. Gastroesophageal reflux disease, esophagitis presence not specified  - pantoprazole (PROTONIX) 40 MG tablet; Take 1  tablet (40 mg total) by mouth daily.  Dispense: 90 tablet; Refill: 3  4. Essential hypertension  - Basic metabolic panel; Future - CBC with Differential/Platelet; Future  5. Hyperglycemia  - Hemoglobin A1c; Future

## 2015-02-26 ENCOUNTER — Telehealth: Payer: Self-pay | Admitting: Family Medicine

## 2015-02-26 NOTE — Telephone Encounter (Signed)
Are you approving that we use them?

## 2015-02-26 NOTE — Telephone Encounter (Signed)
Caller name:Makyna Mckellips Relationship to patient:self Can be reached:[phone]   Reason for call: Checking on status of health screening form left with Dr. Etter Sjogren at last appt 02/20/15. Fax number on form per PT.

## 2015-02-26 NOTE — Telephone Encounter (Signed)
Patient did not come back to have her labs done, she wants to use April 2015's labs. Please advise     KP

## 2015-02-26 NOTE — Telephone Encounter (Signed)
The patient brought in a form that stated we can use the previous dates.     KP

## 2015-02-26 NOTE — Telephone Encounter (Signed)
We will need new labs for benicar--- I can't use year old meds for them --- esp K

## 2015-02-26 NOTE — Telephone Encounter (Signed)
Those are almost a year old

## 2015-02-27 ENCOUNTER — Encounter: Payer: Self-pay | Admitting: Family Medicine

## 2015-03-02 ENCOUNTER — Other Ambulatory Visit (INDEPENDENT_AMBULATORY_CARE_PROVIDER_SITE_OTHER): Payer: BLUE CROSS/BLUE SHIELD

## 2015-03-02 DIAGNOSIS — R739 Hyperglycemia, unspecified: Secondary | ICD-10-CM

## 2015-03-02 DIAGNOSIS — R82998 Other abnormal findings in urine: Secondary | ICD-10-CM

## 2015-03-02 DIAGNOSIS — R319 Hematuria, unspecified: Secondary | ICD-10-CM

## 2015-03-02 DIAGNOSIS — R8290 Unspecified abnormal findings in urine: Secondary | ICD-10-CM

## 2015-03-02 DIAGNOSIS — R7989 Other specified abnormal findings of blood chemistry: Secondary | ICD-10-CM

## 2015-03-02 DIAGNOSIS — Z0000 Encounter for general adult medical examination without abnormal findings: Secondary | ICD-10-CM

## 2015-03-02 DIAGNOSIS — I10 Essential (primary) hypertension: Secondary | ICD-10-CM

## 2015-03-02 LAB — CBC WITH DIFFERENTIAL/PLATELET
BASOS PCT: 0.8 % (ref 0.0–3.0)
Basophils Absolute: 0.1 10*3/uL (ref 0.0–0.1)
EOS PCT: 2.6 % (ref 0.0–5.0)
Eosinophils Absolute: 0.3 10*3/uL (ref 0.0–0.7)
HCT: 40.2 % (ref 36.0–46.0)
Hemoglobin: 14 g/dL (ref 12.0–15.0)
LYMPHS ABS: 2.4 10*3/uL (ref 0.7–4.0)
Lymphocytes Relative: 24.9 % (ref 12.0–46.0)
MCHC: 34.9 g/dL (ref 30.0–36.0)
MCV: 89.3 fl (ref 78.0–100.0)
MONOS PCT: 3.5 % (ref 3.0–12.0)
Monocytes Absolute: 0.3 10*3/uL (ref 0.1–1.0)
NEUTROS PCT: 68.2 % (ref 43.0–77.0)
Neutro Abs: 6.7 10*3/uL (ref 1.4–7.7)
Platelets: 194 10*3/uL (ref 150.0–400.0)
RBC: 4.51 Mil/uL (ref 3.87–5.11)
RDW: 13.1 % (ref 11.5–15.5)
WBC: 9.9 10*3/uL (ref 4.0–10.5)

## 2015-03-02 LAB — LIPID PANEL
CHOL/HDL RATIO: 6
Cholesterol: 201 mg/dL — ABNORMAL HIGH (ref 0–200)
HDL: 32.5 mg/dL — AB (ref >39.00)
NonHDL: 168.5
TRIGLYCERIDES: 285 mg/dL — AB (ref 0.0–149.0)
VLDL: 57 mg/dL — AB (ref 0.0–40.0)

## 2015-03-02 LAB — LDL CHOLESTEROL, DIRECT: Direct LDL: 135 mg/dL

## 2015-03-02 LAB — BASIC METABOLIC PANEL
BUN: 11 mg/dL (ref 6–23)
CO2: 27 mEq/L (ref 19–32)
Calcium: 9.4 mg/dL (ref 8.4–10.5)
Chloride: 101 mEq/L (ref 96–112)
Creatinine, Ser: 0.65 mg/dL (ref 0.40–1.20)
GFR: 112.57 mL/min (ref >60.00)
Glucose, Bld: 368 mg/dL — ABNORMAL HIGH (ref 70–99)
POTASSIUM: 3.8 meq/L (ref 3.5–5.1)
Sodium: 134 mEq/L — ABNORMAL LOW (ref 135–145)

## 2015-03-02 LAB — POCT URINALYSIS DIPSTICK
BILIRUBIN UA: NEGATIVE
Nitrite, UA: NEGATIVE
PH UA: 6
Spec Grav, UA: >=1.03
Urobilinogen, UA: 1

## 2015-03-02 LAB — HEPATIC FUNCTION PANEL
ALT: 186 U/L — ABNORMAL HIGH (ref 0–35)
AST: 155 U/L — ABNORMAL HIGH (ref 0–37)
Albumin: 4.2 g/dL (ref 3.5–5.2)
Alkaline Phosphatase: 111 U/L (ref 39–117)
BILIRUBIN DIRECT: 0.3 mg/dL (ref 0.0–0.3)
BILIRUBIN TOTAL: 0.8 mg/dL (ref 0.2–1.2)
Total Protein: 7.5 g/dL (ref 6.0–8.3)

## 2015-03-02 LAB — HEMOGLOBIN A1C: HEMOGLOBIN A1C: 12.5 % — AB (ref 4.6–6.5)

## 2015-03-02 LAB — TSH: TSH: 7.66 u[IU]/mL — ABNORMAL HIGH (ref 0.35–4.50)

## 2015-03-02 NOTE — Addendum Note (Signed)
Addended by: Harl Bowie on: 03/02/2015 09:40 AM   Modules accepted: Orders

## 2015-03-04 LAB — URINE CULTURE: Colony Count: 95000

## 2015-03-05 ENCOUNTER — Other Ambulatory Visit: Payer: Self-pay

## 2015-03-05 ENCOUNTER — Encounter: Payer: Self-pay | Admitting: Family Medicine

## 2015-03-05 MED ORDER — METFORMIN HCL ER 500 MG PO TB24: 1000.0000 mg | ORAL_TABLET | Freq: Every day | ORAL | Status: DC

## 2015-03-06 ENCOUNTER — Other Ambulatory Visit: Payer: Self-pay

## 2015-03-06 DIAGNOSIS — E119 Type 2 diabetes mellitus without complications: Secondary | ICD-10-CM

## 2015-03-06 MED ORDER — GLUCOSE BLOOD VI STRP: ORAL_STRIP | Status: DC

## 2015-03-06 MED ORDER — ONETOUCH DELICA LANCETS FINE MISC
Status: DC
Start: 2015-03-06 — End: 2015-07-04

## 2015-03-20 ENCOUNTER — Ambulatory Visit (INDEPENDENT_AMBULATORY_CARE_PROVIDER_SITE_OTHER): Payer: BLUE CROSS/BLUE SHIELD | Admitting: Endocrinology

## 2015-03-20 ENCOUNTER — Encounter: Payer: Self-pay | Admitting: Endocrinology

## 2015-03-20 VITALS — BP 140/78 | HR 115 | Temp 98.3°F | Ht 66.0 in | Wt 243.0 lb

## 2015-03-20 DIAGNOSIS — E119 Type 2 diabetes mellitus without complications: Secondary | ICD-10-CM

## 2015-03-20 MED ORDER — LINAGLIPTIN 5 MG PO TABS: 5.0000 mg | ORAL_TABLET | Freq: Every day | ORAL | Status: DC

## 2015-03-20 NOTE — Patient Instructions (Addendum)
We can recheck the thyroid with your next a1c. In view of your medical condition, you should avoid pregnancy until we have decided it is safe good diet and exercise significanly improve the control of your diabetes.  please let me know if you wish to be referred to a dietician.  high blood sugar is very risky to your health.  you should see an eye doctor and dentist every year.  It is very important to get all recommended vaccinations.  controlling your blood pressure and cholesterol drastically reduces the damage diabetes does to your body.  Those who smoke should quit.  please discuss these with your doctor.  Given that your blod pressure was good with Dr Etter Sjogren recently, Please continue the same medication.  For now, please change the metformin to "tradjenta." You may need additional medication to get the blood sugar down to 100.  Therefore, please message [place] each week, so we can add more as necessary. Here is a discount card for "jardiance."  Please come back for a follow-up appointment in 1 month.

## 2015-03-20 NOTE — Progress Notes (Signed)
Subjective:    Patient ID: Monica Perkins, female    DOB: 28-Sep-1981, 34 y.o.   MRN: [id number]  HPI pt states DM was dx'ed in 2015; she has mild if any neuropathy of the lower extremities; she is unaware of any associated chronic complications; she has never been on insulin; pt says her diet and exercise are much better recently; she has never had GDM, pancreatitis, severe hypoglycemia or DKA.  She has been on metformin x a few weeks.  Since on it, she has frequent n/v, even with just -XR, 500 mg qd.  She says cbg's vary from 150-200.  There is no trend throughout the day.   Past Medical History  Diagnosis Date  . Arthritis     knees  . Hypertension   . Migraines   . Fatty liver     [place]  . Gallstones     [place]    Past Surgical History  Procedure Laterality Date  . Tonsillectomy    . Knee arthroscopy  10-2010    B knee surgeries, several    History   Social History  . Marital Status: Single    Spouse Name: N/A  . Number of Children: 0  . 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 Smokeless tobacco: Never Used  . Alcohol Use: Yes     Comment: rarely   . Drug Use: No  . Sexual Activity: Not on file   Other Topics Concern  . Not on file   Social History Narrative   [place] partner    Current Outpatient Prescriptions on File Prior to Visit  Medication Sig Dispense Refill  . drospirenone-ethinyl estradiol (LORYNA) 3-0.02 MG tablet 1 TAB BY MOUTH DAILY 84 tablet 3  . escitalopram (LEXAPRO) 10 MG tablet TAKE 2 TABLETS (20 MG TOTAL) BY MOUTH DAILY. 60 tablet 0  . glucose blood (ONETOUCH VERIO) test strip Check Blood sugar daily 100 each 12  . loratadine-pseudoephedrine (CLARITIN-D 24-HOUR) 10-240 MG per 24 hr tablet Take 1 tablet by mouth daily.    . meloxicam (MOBIC) 15 MG tablet 1/2-1 po qd prn knee pain 30 tablet 2  .  olmesartan-hydrochlorothiazide (BENICAR HCT) 20-12.5 MG per tablet Take 1 tablet by mouth daily. 90 tablet 0  . ONETOUCH DELICA LANCETS FINE MISC Check blood sugar daily 100 each 12  . pantoprazole (PROTONIX) 40 MG tablet Take 1 tablet (40 mg total) by mouth daily. 90 tablet 3  . Pediatric Multiple Vit-C-FA (FLINSTONES GUMMIES OMEGA-3 DHA PO) Take 2 tablets by mouth daily.     No current facility-administered medications on file prior to visit.    Allergies  Allergen Reactions  . Cephalexin   . Penicillins     Family History  Problem Relation Age of Onset  . Lung cancer    . Stroke Father   . Arthritis Father   . Cancer Father     lung  . Arthritis Mother   . Hyperlipidemia Mother   . Alcohol abuse Maternal Aunt   . Diabetes Maternal Aunt   . Arthritis Maternal Grandmother   . Heart disease Maternal Grandmother   . Hyperlipidemia Maternal Grandmother   . Hypertension Maternal Grandmother   . Arthritis Maternal Grandfather   . Heart disease Maternal Grandfather     BP 140/78 mmHg  Pulse 115  Temp(Src) 98.3 F (36.8  C) (Oral)  Ht 5\' 6"  (1.676 m)  Wt 243 lb (110.224 kg)  BMI 39.24 kg/m2  SpO2 97%  LMP 12/25/2014    Review of Systems denies weight loss, blurry vision, headache, chest pain, sob, diarrhea, urinary frequency, muscle cramps, excessive diaphoresis, depression, cold intolerance, and easy bruising.  She has rhinorrhea.  She has monthly bleeding on OC's.      Objective:   Physical Exam VITAL SIGNS:  See vs page GENERAL: no distress Pulses: dorsalis pedis intact bilat.   MSK: no deformity of the feet CV: no leg edema Skin:  no ulcer on the feet.  normal color and temp on the feet. Neuro: sensation is intact to touch on the feet.     Lab Results  Component Value Date   HGBA1C 12.5* 03/02/2015      Assessment & Plan:  DM: severe exacerbation Obesity: new to me: I advised surgery: she declines Nausea: due to metformin   Patient is advised the  following: Patient Instructions  We can recheck the thyroid with your next a1c. In view of your medical condition, you should avoid pregnancy until we have decided it is safe good diet and exercise significanly improve the control of your diabetes.  please let me know if you wish to be referred to a dietician.  high blood sugar is very risky to your health.  you should see an eye doctor and dentist every year.  It is very important to get all recommended vaccinations.  controlling your blood pressure and cholesterol drastically reduces the damage diabetes does to your body.  Those who smoke should quit.  please discuss these with your doctor.  Given that your blod pressure was good with Dr Etter Sjogren recently, Please continue the same medication.  For now, please change the metformin to "tradjenta." You may need additional medication to get the blood sugar down to 100.  Therefore, please message [place] each week, so we can add more as necessary. Here is a discount card for "jardiance."  Please come back for a follow-up appointment in 1 month.

## 2015-03-27 ENCOUNTER — Encounter: Payer: Self-pay | Admitting: Endocrinology

## 2015-03-30 ENCOUNTER — Encounter: Payer: Self-pay | Admitting: Family Medicine

## 2015-03-30 MED ORDER — GLUCOSE BLOOD VI STRP: ORAL_STRIP | Status: DC

## 2015-03-31 ENCOUNTER — Other Ambulatory Visit: Payer: Self-pay | Admitting: Family Medicine

## 2015-04-04 ENCOUNTER — Other Ambulatory Visit: Payer: Self-pay | Admitting: Endocrinology

## 2015-04-04 ENCOUNTER — Encounter: Payer: Self-pay | Admitting: Family Medicine

## 2015-04-04 DIAGNOSIS — E119 Type 2 diabetes mellitus without complications: Secondary | ICD-10-CM

## 2015-04-09 ENCOUNTER — Telehealth: Payer: Self-pay | Admitting: *Deleted

## 2015-04-09 NOTE — Telephone Encounter (Signed)
Prior auth for pantoprazole initiated. Awaiting determination. JG//CMA

## 2015-04-10 NOTE — Telephone Encounter (Signed)
PA approved.

## 2015-04-16 ENCOUNTER — Encounter: Payer: Self-pay | Admitting: Family Medicine

## 2015-04-16 NOTE — Telephone Encounter (Signed)
Needs an appointment.

## 2015-04-20 ENCOUNTER — Encounter: Payer: Self-pay | Admitting: Endocrinology

## 2015-04-20 ENCOUNTER — Encounter: Payer: Self-pay | Admitting: Dietician

## 2015-04-20 ENCOUNTER — Encounter: Payer: BLUE CROSS/BLUE SHIELD | Attending: Endocrinology | Admitting: Dietician

## 2015-04-20 ENCOUNTER — Ambulatory Visit (INDEPENDENT_AMBULATORY_CARE_PROVIDER_SITE_OTHER): Payer: BLUE CROSS/BLUE SHIELD | Admitting: Endocrinology

## 2015-04-20 VITALS — BP 134/80 | HR 101 | Temp 98.1°F | Ht 66.0 in | Wt 235.0 lb

## 2015-04-20 VITALS — Ht 66.0 in | Wt 235.0 lb

## 2015-04-20 DIAGNOSIS — Z713 Dietary counseling and surveillance: Secondary | ICD-10-CM | POA: Insufficient documentation

## 2015-04-20 DIAGNOSIS — E119 Type 2 diabetes mellitus without complications: Secondary | ICD-10-CM | POA: Diagnosis present

## 2015-04-20 NOTE — Progress Notes (Signed)
  Medical Nutrition Therapy:  Appt start time: 6389 end time:  1700.   Assessment:  Primary concerns today:  Patient wishes to know more about nutrition to better improve her diabetes.  She recently purchased Reversing Diabetes by Alyssa [place].  Patient has had diabetes since 2015.  Checks blood sugar >2x per day.  AM: (135-140), after work 108 (5-7pm)-before dinner and 2 hours after meal (98-110).  Lipids noted with HDL of 32.5, triglycerides 285 and cholesterol 201 (03/02/15).  Lives with partner Delsa Sale and shares cooking and shopping.  Delsa Sale has had bariatric surgery.  Patient is not interested in this at this time.  She has lost from 260 lbs in November 2015 to current weight.  Patient feels weight gain due to Lexapro and has since weaned off of this medication.  TANITA  BODY COMP RESULTS 04/20/15 232 lbs   BMI (kg/m^2) 36.3   Fat Mass (lbs) 109   Fat Free Mass (lbs) 123   Total Body Water (lbs) 90  46.9% body fat  Preferred Learning Style:   No preference indicated   Learning Readiness:   Ready  Change in progress  MEDICATIONS: see list which includes Tradjenta   DIETARY INTAKE: Patient eats a lot of variety overall but not of fruits or vegetables. 24-hr recall:  B ( AM): bagel thin with cream cheese, 1 fruit or sheetz omelette wrap and fruit  Or egg and cheese biscuit (minus some of the bread) or pretzels and hummus or low carb fritatta  Snk ( AM): fruit, cheese, yogurt bar, ice cream L ( PM): atkins pizza or other atkins meal or salad or leftovers Snk ( PM): protein bar D ( PM): meat and veges, few starches Snk ( PM): see morning snacks Beverages: Sugar free Rockstar (1 daily), water, G2 gatorade, flavored water  Usual physical activity: backpacking, yardwork, walking  Estimated energy needs: 1800 calories 200 g carbohydrates 135 g protein 50 g fat  Progress Towards Goal(s):  In progress.   Nutritional Diagnosis:  NB-1.1 Food and nutrition-related knowledge deficit As  related to balance of carbohydrates, protein, and fat.  As evidenced by food records.    Intervention:  Nutrition counseling and diabetes education initiated. Discussed Carb Counting by food group as method of portion control, reading food labels, and benefits of increased activity.  Nutrition discussed in relationship to hyperlipidemia and weight as well.  Discussed the plant based diet for treatment of diabetes as well.  Also discussed basic physiology of Diabetes, target BG ranges pre and post meals, and A1c.   Great job on the changes that you have made!  -Daily exercise  -Changing your beverages  -Modifying your carbohydrates Balance carbohydrates throughout the day. Increase veges to half the plate Consider increased plant protein. Protein in small amounts with meals and snacks.  Teaching Method Utilized:  Visual Auditory Hands on  Handouts given during visit include:  Tanita sheet and results  Meal plan card  Living well with Diabetes  Snack sheet  Barriers to learning/adherence to lifestyle change: none  Demonstrated degree of understanding via:  Teach Back   Monitoring/Evaluation:  Dietary intake, exercise, label reading, and body weight prn.

## 2015-04-20 NOTE — Patient Instructions (Addendum)
In view of your medical condition, you should avoid pregnancy until we have decided it is safe.   Please continue your weight loss efforts, as these are working.   Please come back for a follow-up appointment in 6 weeks, with blood tests a few days prior.

## 2015-04-20 NOTE — Patient Instructions (Signed)
Great job on the changes that you have made!  -Daily exercise  -Changing your beverages  -Modifying your carbohydrates Balance carbohydrates throughout the day. Increase veges to half the plate Consider increased plant protein. Protein in small amounts with meals and snacks.

## 2015-04-20 NOTE — Progress Notes (Signed)
Subjective:    Patient ID: Monica Perkins, female    DOB: 09-Jan-1981, 34 y.o.   MRN: [id number]  HPI  Pt returns for f/u of diabetes mellitus: DM type: 2 Dx'ed: 4235 Complications: none Therapy: tradjenta GDM: never DKA: never Severe hypoglycemia: never Pancreatitis: never Other: she did not tolerate metformin (n/v, even with just -XR, 500 mg qd) Interval history: Pt says cbg's are in the low-100's.  She has lost weight, due to her efforts. Past Medical History  Diagnosis Date  . Arthritis     knees  . Hypertension   . Migraines   . Fatty liver     [place]  . Gallstones     [place]  . Diabetes mellitus without complication     Past Surgical History  Procedure Laterality Date  . Tonsillectomy    . Knee arthroscopy  10-2010    B knee surgeries, several    History   Social History  . Marital Status: Single    Spouse Name: N/A  . Number of Children: 0  . 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 Smoking status: Former Smoker    Quit date: 12/12/2011  . Smokeless tobacco: Never Used  . Alcohol Use: Yes     Comment: rarely   . Drug Use: No  . Sexual Activity: Not on file   Other Topics Concern  . Not on file   Social History Narrative   [place] partner    Current Outpatient Prescriptions on File Prior to Visit  Medication Sig Dispense Refill  . drospirenone-ethinyl estradiol (LORYNA) 3-0.02 MG tablet 1 TAB BY MOUTH DAILY 84 tablet 3  . glucose blood (ONETOUCH VERIO) test strip Check Blood sugar twice daily. Onetouch Verio test strips 100 each 12  . linagliptin (TRADJENTA) 5 MG TABS tablet Take 5 mg by mouth daily.    Marland Kitchen loratadine-pseudoephedrine (CLARITIN-D 24-HOUR) 10-240 MG per 24 hr tablet Take 1 tablet by mouth daily.    . meloxicam (MOBIC) 15 MG tablet 1/2-1 po qd prn knee pain 30 tablet 2  . olmesartan-hydrochlorothiazide (BENICAR HCT) 20-12.5 MG per tablet Take 1 tablet by mouth daily. 90  tablet 0  . ONETOUCH DELICA LANCETS FINE MISC Check blood sugar daily 100 each 12  . Pediatric Multiple Vit-C-FA (FLINSTONES GUMMIES OMEGA-3 DHA PO) Take 2 tablets by mouth daily.    Marland Kitchen escitalopram (LEXAPRO) 10 MG tablet TAKE 2 TABLETS (20 MG TOTAL) BY MOUTH DAILY. (Patient not taking: Reported on 04/20/2015) 60 tablet 11  . pantoprazole (PROTONIX) 40 MG tablet Take 1 tablet (40 mg total) by mouth daily. 90 tablet 3   No current facility-administered medications on file prior to visit.    Allergies  Allergen Reactions  . Cephalexin   . Penicillins     Family History  Problem Relation Age of Onset  . Lung cancer    . Stroke Father   . Arthritis Father   . Cancer Father     lung  . Arthritis Mother   . Hyperlipidemia Mother   . Alcohol abuse Maternal Aunt   . Diabetes Maternal Aunt   . Arthritis Maternal Grandmother   . Heart disease Maternal Grandmother   . Hyperlipidemia Maternal Grandmother   . Hypertension Maternal Grandmother   . Arthritis Maternal Grandfather   . Heart disease Maternal Grandfather     BP 134/80 mmHg  Pulse 101  Temp(Src) 98.1 F (36.7 C) (  Oral)  Ht 5\' 6"  (1.676 m)  Wt 235 lb (106.595 kg)  BMI 37.95 kg/m2  SpO2 96%   Review of Systems She denies edema    Objective:   Physical Exam VITAL SIGNS:  See vs page GENERAL: no distress Ext: no edema.     Assessment & Plan:  Obesity, improved: I advised surgery: she declines.   She declines addition of jardiance for now.    Patient is advised the following: Patient Instructions  In view of your medical condition, you should avoid pregnancy until we have decided it is safe.   Please continue your weight loss efforts, as these are working.   Please come back for a follow-up appointment in 6 weeks, with blood tests a few days prior.

## 2015-05-03 ENCOUNTER — Encounter: Payer: Self-pay | Admitting: Family Medicine

## 2015-05-14 MED ORDER — GLUCOSE BLOOD VI STRP: ORAL_STRIP | Status: AC

## 2015-05-14 NOTE — Addendum Note (Signed)
Addended by: Ewing Schlein on: 05/14/2015 10:55 AM   Modules accepted: Orders

## 2015-06-22 ENCOUNTER — Other Ambulatory Visit: Payer: Self-pay | Admitting: Family Medicine

## 2015-06-25 ENCOUNTER — Encounter: Payer: Self-pay | Admitting: Medical

## 2015-06-25 ENCOUNTER — Ambulatory Visit (INDEPENDENT_AMBULATORY_CARE_PROVIDER_SITE_OTHER): Payer: BLUE CROSS/BLUE SHIELD | Admitting: Medical

## 2015-06-25 VITALS — BP 130/80 | HR 92 | Temp 98.8°F | Ht 66.0 in | Wt 226.6 lb

## 2015-06-25 DIAGNOSIS — I10 Essential (primary) hypertension: Secondary | ICD-10-CM

## 2015-06-25 NOTE — Progress Notes (Signed)
Pre visit review using our clinic review tool, if applicable. No additional management support is needed unless otherwise documented below in the visit note. 

## 2015-06-25 NOTE — Patient Instructions (Addendum)
Pt bp good today. Continue benicar 20/12.5 twice day. Check bp daily. If you are getting bp 120/70 or less then could go back to one tab a day. But give [place] update on your recorded bp level in 2 weeks or sooner.  Note I would also have you strongly think of starting ocp. Or if you do miss cycle then early on check preg test.Since benicar is category D medication for pregnancy.  Follow up in 2 wks or as needed. Banana a day to keep k in balance while on hctz.  cmp order placed today.

## 2015-06-25 NOTE — Progress Notes (Signed)
Subjective:    Patient ID: Monica Perkins, female    DOB: Apr 13, 1981, 34 y.o.   MRN: [id number]  HPI  Pt in stating she has been on birth control for a long times. Pt states she was off her birth control tablet for 2 months. After she stopped her ocp she stopped her blood pressure medication 7 days later. For 2 months she was checking her bp and it was 125/78. But about 10 days ago her bp randomly she got ha. She did not check her bp. She took prior benciar and went to sleep her ha went away. Then about 7 days later/this pat week Saturday am dizzy, weak and nauseau. Her bp was 160/110. Pt was told to take benicar 20/12.5 mg bid by UC near [address]. Pt states she has very faint ha now but a lot better than on Saturday.   Pt also stopped her tradjenta. She got this ok'd with her endocnrinolgist. She will check this at end of July.  Pt also stopped lexapro. She denies any depression or anxiety.  LMP- [place] of June and ws normal. . Pt does not want to be on ocp again. States was only on medication to control menses and cramps.     Review of Systems  Constitutional: Negative for fever, chills and fatigue.  Respiratory: Negative for cough, choking, chest tightness, shortness of breath and wheezing.   Cardiovascular: Negative for chest pain and palpitations.  Musculoskeletal: Negative for back pain.  Neurological: Positive for headaches. Negative for dizziness.       Very faint/low level.  Hematological: Negative for adenopathy. Does not bruise/bleed easily.  Psychiatric/Behavioral: Negative for suicidal ideas, behavioral problems, confusion, sleep disturbance, dysphoric mood and decreased concentration.    Past Medical History  Diagnosis Date  . Arthritis     knees  . Hypertension   . Migraines   . Fatty liver     [place]  . Gallstones     [place]  . Diabetes mellitus without complication     History   Social History  . Marital Status: Single    Spouse Name: N/A  . Number of  Children: 0  . 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 Smokeless tobacco: Never Used  . Alcohol Use: Yes     Comment: rarely   . Drug Use: No  . Sexual Activity: Not on file   Other Topics Concern  . Not on file   Social History Narrative   [place] partner    Past Surgical History  Procedure Laterality Date  . Tonsillectomy    . Knee arthroscopy  10-2010    B knee surgeries, several    Family History  Problem Relation Age of Onset  . Lung cancer    . Stroke Father   . Arthritis Father   . Cancer Father     lung  . Arthritis Mother   . Hyperlipidemia Mother   . Alcohol abuse Maternal Aunt   . Diabetes Maternal Aunt   . Arthritis Maternal Grandmother   . Heart disease Maternal Grandmother   . Hyperlipidemia Maternal Grandmother   . Hypertension Maternal Grandmother   . Arthritis Maternal Grandfather   . Heart disease Maternal Grandfather     Allergies  Allergen Reactions  . Cephalexin   . Penicillins     Current Outpatient  Prescriptions on File Prior to Visit  Medication Sig Dispense Refill  . BENICAR HCT 20-12.5 MG per tablet TAKE 1 TABLET BY MOUTH DAILY. 90 tablet 1  . glucose blood (ONETOUCH VERIO) test strip Check Blood sugar twice daily. Onetouch Verio test strips 200 each 12  . Krill Oil Omega-3 300 MG CAPS Take by mouth.    Glory Rosebush DELICA LANCETS FINE MISC Check blood sugar daily 100 each 12  . drospirenone-ethinyl estradiol (LORYNA) 3-0.02 MG tablet 1 TAB BY MOUTH DAILY (Patient not taking: Reported on 06/25/2015) 84 tablet 3  . escitalopram (LEXAPRO) 10 MG tablet TAKE 2 TABLETS (20 MG TOTAL) BY MOUTH DAILY. (Patient not taking: Reported on 04/20/2015) 60 tablet 11  . linagliptin (TRADJENTA) 5 MG TABS tablet Take 5 mg by mouth daily.    Marland Kitchen loratadine-pseudoephedrine (CLARITIN-D 24-HOUR) 10-240 MG per 24 hr tablet Take 1  tablet by mouth daily.    . meloxicam (MOBIC) 15 MG tablet 1/2-1 po qd prn knee pain (Patient not taking: Reported on 06/25/2015) 30 tablet 2  . pantoprazole (PROTONIX) 40 MG tablet Take 1 tablet (40 mg total) by mouth daily. (Patient not taking: Reported on 06/25/2015) 90 tablet 3  . Pediatric Multiple Vit-C-FA (FLINSTONES GUMMIES OMEGA-3 DHA PO) Take 2 tablets by mouth daily.     No current facility-administered medications on file prior to visit.    BP 130/80 mmHg  Pulse 92  Temp(Src) 98.8 F (37.1 C) (Oral)  Ht 5\' 6"  (1.676 m)  Wt 226 lb 9.6 oz (102.785 kg)  BMI 36.59 kg/m2  SpO2 99%  LMP 06/04/2015       Objective:   Physical Exam   General Mental Status- Alert. General Appearance- Not in acute distress.   Skin General: Color- Normal Color. Moisture- Normal Moisture.  Neck Carotid Arteries- Normal color. Moisture- Normal Moisture. No carotid bruits. No JVD.  Chest and Lung Exam Auscultation: Breath Sounds:-Normal.  Cardiovascular Auscultation:Rythm- Regular. Murmurs & Other Heart Sounds:Auscultation of the heart reveals- No Murmurs.  Abdomen Inspection:-Inspeection Normal. Palpation/Percussion:Note:No mass. Palpation and Percussion of the abdomen reveal- Non Tender, Non Distended + BS, no rebound or guarding.    Neurologic Cranial Nerve exam:- CN III-XII intact(No nystagmus), symmetric smile. Drift Test:- No drift. Romberg Exam:- Negative.  Heal to Toe Gait exam:-Normal. Finger to Nose:- Normal/Intact Strength:- 5/5 equal and symmetric strength both upper and lower extremities.       Assessment & Plan:

## 2015-06-25 NOTE — Assessment & Plan Note (Signed)
Pt bp good today. Continue benicar 20/12.5 twice day. Check bp daily. If you are getting bp 120/70 or less then could go back to one tab a day. But give [place] update on your recorded bp level in 2 weeks or sooner.  Note I would also have you strongly think of starting ocp. Or if you do miss cycle then early on check preg test.Since benicar is category D medication for pregnancy.

## 2015-06-26 LAB — COMPREHENSIVE METABOLIC PANEL
ALT: 43 U/L — ABNORMAL HIGH (ref 0–35)
AST: 32 U/L (ref 0–37)
Albumin: 4.9 g/dL (ref 3.5–5.2)
Alkaline Phosphatase: 90 U/L (ref 39–117)
BILIRUBIN TOTAL: 0.7 mg/dL (ref 0.2–1.2)
BUN: 15 mg/dL (ref 6–23)
CO2: 29 mEq/L (ref 19–32)
Calcium: 10.3 mg/dL (ref 8.4–10.5)
Chloride: 102 mEq/L (ref 96–112)
Creatinine, Ser: 0.75 mg/dL (ref 0.40–1.20)
GFR: 95.24 mL/min (ref >60.00)
Glucose, Bld: 111 mg/dL — ABNORMAL HIGH (ref 70–99)
Potassium: 3.8 mEq/L (ref 3.5–5.1)
Sodium: 140 mEq/L (ref 135–145)
TOTAL PROTEIN: 8.5 g/dL — AB (ref 6.0–8.3)

## 2015-07-04 ENCOUNTER — Ambulatory Visit (INDEPENDENT_AMBULATORY_CARE_PROVIDER_SITE_OTHER): Payer: BLUE CROSS/BLUE SHIELD | Admitting: Family Medicine

## 2015-07-04 ENCOUNTER — Encounter: Payer: Self-pay | Admitting: Family Medicine

## 2015-07-04 VITALS — BP 140/80 | HR 99 | Temp 97.9°F | Resp 16 | Wt 231.5 lb

## 2015-07-04 DIAGNOSIS — R51 Headache: Secondary | ICD-10-CM

## 2015-07-04 DIAGNOSIS — J0100 Acute maxillary sinusitis, unspecified: Secondary | ICD-10-CM

## 2015-07-04 DIAGNOSIS — R519 Headache, unspecified: Secondary | ICD-10-CM | POA: Insufficient documentation

## 2015-07-04 MED ORDER — SULFAMETHOXAZOLE-TRIMETHOPRIM 800-160 MG PO TABS: 1.0000 | ORAL_TABLET | Freq: Two times a day (BID) | ORAL | Status: DC

## 2015-07-04 NOTE — Assessment & Plan Note (Signed)
New to provider.  Pt has hx of similar.  Pt's sxs and PE consistent w/ infxn.  Start abx.  Reviewed supportive care and red flags that should prompt return.  Pt expressed understanding and is in agreement w/ plan.

## 2015-07-04 NOTE — Progress Notes (Signed)
Pre visit review using our clinic review tool, if applicable. No additional management support is needed unless otherwise documented below in the visit note. 

## 2015-07-04 NOTE — Assessment & Plan Note (Signed)
New to provider.  Pt has remote hx of migraines- pt reports this does not feel similar. Pt's sinus pain, HA, and dizziness consistent w/ sinus infxn.  Start abx.  If HAs don't improve w/ tx of sinusitis, pt will need f/u w/ PCP or referral to neuro.  Pt expressed understanding and is in agreement w/ plan.

## 2015-07-04 NOTE — Patient Instructions (Signed)
Follow up as needed Your symptoms and exam are consistent with a sinus infection Start the Bactrim twice daily- take w/ food- for the sinus infection Start Claritin or Zyrtec daily until feeling better Drink plenty of water If no improvement in headaches, please call and schedule a follow up appt with Dr Etter Sjogren Call with any questions or concerns Monica Perkins in there!!!

## 2015-07-04 NOTE — Progress Notes (Signed)
   Subjective:    Patient ID: RANITA STJULIEN, female    DOB: 12-Mar-1981, 34 y.o.   MRN: [id number]  HPI HA- pt reports that she stopped BP meds which caused BP to shoot up and pt developed a HA.  HA improved with restarting Benicar.  Edward recommended she increase Benicar to 40mg  daily but this caused dizziness.  Pt decreased back to 20mg  but HA remains- 'constant headache in the back of my head and my sinuses'.  + nausea.  Continues to have dizziness- 'not really bad but kinda off kilter'.  Hx of migraines but none since age 43.  Current HAs are not consistent w/ previous migraines.  + maxillary facial pain.  Pt's last A1C was 12.4 (seeing Dr Loanne Drilling).  + nasal congestion, PND.  Review of Systems For ROS see HPI     Objective:   Physical Exam  Constitutional: She appears well-developed and well-nourished. No distress.  HENT:  Head: Normocephalic and atraumatic.  Right Ear: Tympanic membrane normal.  Left Ear: Tympanic membrane normal.  Nose: Mucosal edema and rhinorrhea present. Right sinus exhibits maxillary sinus tenderness and frontal sinus tenderness. Left sinus exhibits maxillary sinus tenderness and frontal sinus tenderness.  Mouth/Throat: Uvula is midline and mucous membranes are normal. Posterior oropharyngeal erythema present. No oropharyngeal exudate.  Eyes: Conjunctivae and EOM are normal. Pupils are equal, round, and reactive to light.  Neck: Normal range of motion. Neck supple.  Cardiovascular: Normal rate, regular rhythm and normal heart sounds.   Pulmonary/Chest: Effort normal and breath sounds normal. No respiratory distress. She has no wheezes.  Lymphadenopathy:    She has no cervical adenopathy.  Vitals reviewed.         Assessment & Plan:

## 2015-07-05 ENCOUNTER — Encounter: Payer: Self-pay | Admitting: Family Medicine

## 2015-07-11 ENCOUNTER — Encounter: Payer: Self-pay | Admitting: Family Medicine

## 2015-09-12 ENCOUNTER — Ambulatory Visit (INDEPENDENT_AMBULATORY_CARE_PROVIDER_SITE_OTHER): Payer: BLUE CROSS/BLUE SHIELD | Admitting: Physician Assistant

## 2015-09-12 ENCOUNTER — Encounter: Payer: Self-pay | Admitting: Physician Assistant

## 2015-09-12 VITALS — BP 132/90 | HR 95 | Temp 98.4°F | Resp 16 | Ht 66.0 in | Wt 238.5 lb

## 2015-09-12 DIAGNOSIS — J019 Acute sinusitis, unspecified: Secondary | ICD-10-CM | POA: Diagnosis not present

## 2015-09-12 DIAGNOSIS — B9689 Other specified bacterial agents as the cause of diseases classified elsewhere: Secondary | ICD-10-CM

## 2015-09-12 MED ORDER — HYDROCODONE-HOMATROPINE 5-1.5 MG/5ML PO SYRP: 5.0000 mL | ORAL_SOLUTION | Freq: Three times a day (TID) | ORAL | Status: DC | PRN

## 2015-09-12 MED ORDER — DOXYCYCLINE HYCLATE 100 MG PO CAPS: 100.0000 mg | ORAL_CAPSULE | Freq: Two times a day (BID) | ORAL | Status: DC

## 2015-09-12 NOTE — Progress Notes (Signed)
Pre visit review using our clinic review tool, if applicable. No additional management support is needed unless otherwise documented below in the visit note/SLS  

## 2015-09-12 NOTE — Patient Instructions (Signed)
Please take antibiotic as directed.  Increase fluid intake.  Use Saline nasal spray.  Take a daily multivitamin. Use hycodan as directed for cough.  Place a humidifier in the bedroom.  Please call or return clinic if symptoms are not improving.  Sinusitis Sinusitis is redness, soreness, and swelling (inflammation) of the paranasal sinuses. Paranasal sinuses are air pockets within the bones of your face (beneath the eyes, the middle of the forehead, or above the eyes). In healthy paranasal sinuses, mucus is able to drain out, and air is able to circulate through them by way of your nose. However, when your paranasal sinuses are inflamed, mucus and air can become trapped. This can allow bacteria and other germs to grow and cause infection. Sinusitis can develop quickly and last only a short time (acute) or continue over a long period (chronic). Sinusitis that lasts for more than 12 weeks is considered chronic.  CAUSES  Causes of sinusitis include:  Allergies.  Structural abnormalities, such as displacement of the cartilage that separates your nostrils (deviated septum), which can decrease the air flow through your nose and sinuses and affect sinus drainage.  Functional abnormalities, such as when the small hairs (cilia) that line your sinuses and help remove mucus do not work properly or are not present. SYMPTOMS  Symptoms of acute and chronic sinusitis are the same. The primary symptoms are pain and pressure around the affected sinuses. Other symptoms include:  Upper toothache.  Earache.  Headache.  Bad breath.  Decreased sense of smell and taste.  A cough, which worsens when you are lying flat.  Fatigue.  Fever.  Thick drainage from your nose, which often is green and may contain pus (purulent).  Swelling and warmth over the affected sinuses. DIAGNOSIS  Your caregiver will perform a physical exam. During the exam, your caregiver may:  Look in your nose for signs of abnormal  growths in your nostrils (nasal polyps).  Tap over the affected sinus to check for signs of infection.  View the inside of your sinuses (endoscopy) with a special imaging device with a light attached (endoscope), which is inserted into your sinuses. If your caregiver suspects that you have chronic sinusitis, one or more of the following tests may be recommended:  Allergy tests.  Nasal culture A sample of mucus is taken from your nose and sent to a lab and screened for bacteria.  Nasal cytology A sample of mucus is taken from your nose and examined by your caregiver to determine if your sinusitis is related to an allergy. TREATMENT  Most cases of acute sinusitis are related to a viral infection and will resolve on their own within 10 days. Sometimes medicines are prescribed to help relieve symptoms (pain medicine, decongestants, nasal steroid sprays, or saline sprays).  However, for sinusitis related to a bacterial infection, your caregiver will prescribe antibiotic medicines. These are medicines that will help kill the bacteria causing the infection.  Rarely, sinusitis is caused by a fungal infection. In theses cases, your caregiver will prescribe antifungal medicine. For some cases of chronic sinusitis, surgery is needed. Generally, these are cases in which sinusitis recurs more than 3 times per year, despite other treatments. HOME CARE INSTRUCTIONS   Drink plenty of water. Water helps thin the mucus so your sinuses can drain more easily.  Use a humidifier.  Inhale steam 3 to 4 times a day (for example, sit in the bathroom with the shower running).  Apply a warm, moist washcloth to your face  3 to 4 times a day, or as directed by your caregiver.  Use saline nasal sprays to help moisten and clean your sinuses.  Take over-the-counter or prescription medicines for pain, discomfort, or fever only as directed by your caregiver. SEEK IMMEDIATE MEDICAL CARE IF:  You have increasing pain or  severe headaches.  You have nausea, vomiting, or drowsiness.  You have swelling around your face.  You have vision problems.  You have a stiff neck.  You have difficulty breathing. MAKE SURE YOU:   Understand these instructions.  Will watch your condition.  Will get help right away if you are not doing well or get worse. Document Released: 12/15/2005 Document Revised: 03/08/2012 Document Reviewed: 12/30/2011 Exit[employer] Patient Information 2014 [employer].   

## 2015-09-12 NOTE — Progress Notes (Signed)
Patient presents to clinic today c/o 4-5 days of severe cough, sinus pressure, sinus pain, ear pain, headaches, non productive cough and fatigue. Denies fever, chest pain or shortness of breath. Endorses tooth pain x 1.5 days. Denies recent travel. Denies hx of asthma but notes seasonal allergies. Is not taking anything presently for symptoms.  Past Medical History  Diagnosis Date  . Arthritis     knees  . Hypertension   . Migraines   . Fatty liver     [place]  . Gallstones     [place]  . Diabetes mellitus without complication     Current Outpatient Prescriptions on File Prior to Visit  Medication Sig Dispense Refill  . BENICAR HCT 20-12.5 MG per tablet TAKE 1 TABLET BY MOUTH DAILY. 90 tablet 1  . glucose blood (ONETOUCH VERIO) test strip Check Blood sugar twice daily. Onetouch Verio test strips 200 each 12  . Krill Oil Omega-3 300 MG CAPS Take by mouth.     No current facility-administered medications on file prior to visit.    Allergies  Allergen Reactions  . Cephalexin Hives  . Penicillins Rash    Family History  Problem Relation Age of Onset  . Lung cancer    . Stroke Father   . Arthritis Father   . Cancer Father     lung  . Arthritis Mother   . Hyperlipidemia Mother   . Alcohol abuse Maternal Aunt   . Diabetes Maternal Aunt   . Arthritis Maternal Grandmother   . Heart disease Maternal Grandmother   . Hyperlipidemia Maternal Grandmother   . Hypertension Maternal Grandmother   . Arthritis Maternal Grandfather   . Heart disease Maternal Grandfather     Social History   Social History  . Marital Status: Single    Spouse Name: N/A  . Number of Children: 0  . 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 Smokeless tobacco: Never Used  . Alcohol Use: Yes     Comment: rarely   . Drug Use: No  . Sexual Activity: Not Asked   Other Topics  Concern  . None   Social History Narrative   Single-life partner   Review of Systems - See HPI.  All other ROS are negative.  BP 132/90 mmHg  Pulse 95  Temp(Src) 98.4 F (36.9 C) (Oral)  Resp 16  Ht 5\' 6"  (1.676 m)  Wt 238 lb 8 oz (108.183 kg)  BMI 38.51 kg/m2  SpO2 98%  LMP 08/20/2015  Physical Exam  Constitutional: She is oriented to person, place, and time.  Cardiovascular: Normal rate, normal heart sounds and intact distal pulses.   Pulmonary/Chest: Effort normal and breath sounds normal. No respiratory distress. She has no wheezes. She has no rales. She exhibits no tenderness.  Neurological: She is alert and oriented to person, place, and time.  Vitals reviewed.   Recent Results (from the past 2160 hour(s))  Comprehensive metabolic panel     Status: Abnormal   Collection Time: 06/25/15  3:45 PM  Result Value Ref Range   Sodium 140 135 - 145 mEq/L   Potassium 3.8 3.5 - 5.1 mEq/L   Chloride 102 96 - 112 mEq/L   CO2 29 19 - 32 mEq/L   Glucose, Bld 111 (H) 70 - 99 mg/dL   BUN 15 6 - 23 mg/dL  Creatinine, Ser 0.75 0.40 - 1.20 mg/dL   Total Bilirubin 0.7 0.2 - 1.2 mg/dL   Alkaline Phosphatase 90 39 - 117 U/L   AST 32 0 - 37 U/L   ALT 43 (H) 0 - 35 U/L   Total Protein 8.5 (H) 6.0 - 8.3 g/dL   Albumin 4.9 3.5 - 5.2 g/dL   Calcium 10.3 8.4 - 10.5 mg/dL   GFR 95.24 >60.00 mL/min    Assessment/Plan: Acute bacterial sinusitis Rx Doxycycline.  Increase fluids.  Rest.  Saline nasal spray.  Probiotic.  Mucinex as directed.  Humidifier in bedroom. Hycodan as directed.  Call or return to clinic if symptoms are not improving.

## 2015-09-12 NOTE — Assessment & Plan Note (Signed)
Rx Doxycycline.  Increase fluids.  Rest.  Saline nasal spray.  Probiotic.  Mucinex as directed.  Humidifier in bedroom. Hycodan as directed.  Call or return to clinic if symptoms are not improving.

## 2015-09-13 ENCOUNTER — Telehealth: Payer: Self-pay | Admitting: Family Medicine

## 2015-09-13 NOTE — Telephone Encounter (Signed)
It has been less than 24 hours. She needs to give medications more time to work. Wouldn't make any changes presently.

## 2015-09-13 NOTE — Telephone Encounter (Signed)
Caller name: Margee Trentham  Relationship to patient: Self  Can be reached: [phone]  Pharmacy:  Reason for call: pt states that the Rx that was provided yesterday isnt working for her so she would like a different one. Please call back to advise.

## 2015-09-14 MED ORDER — LEVOFLOXACIN 500 MG PO TABS: 500.0000 mg | ORAL_TABLET | Freq: Every day | ORAL | Status: DC

## 2015-09-14 NOTE — Telephone Encounter (Signed)
That information would have been very helpful. Messages from patient should include all of their concerns as that impacts decisions we make. Will stop Doxycycline and begin Levaquin as patient allergic to Penicillins and Cephalosporins. Rx sent to pharmacy.

## 2015-09-14 NOTE — Addendum Note (Signed)
Addended by: Raiford Noble on: 09/14/2015 09:12 AM   Modules accepted: Orders

## 2015-09-14 NOTE — Telephone Encounter (Signed)
Spoke with patient and apologized for the delay on response to request, as this message was responded to by provider on Thurs, 09.15.16; also, patient states that she told employee that Rx was causing nausea & vomiting and was told by [facility] employee that "she would just send note that new medication"; apologized once again for this & informed patient that provider would be informed of this/SLS

## 2015-09-14 NOTE — Telephone Encounter (Signed)
LMOM with contact name and number [for return cal, if needed] RE: new antibiotic and further provider instructions; again apologized to patient for issue with original phone note & informed that provider and supervisor were made aware/SLS

## 2015-11-02 ENCOUNTER — Encounter: Payer: Self-pay | Admitting: Physician Assistant

## 2015-11-02 ENCOUNTER — Ambulatory Visit (INDEPENDENT_AMBULATORY_CARE_PROVIDER_SITE_OTHER): Payer: BLUE CROSS/BLUE SHIELD | Admitting: Physician Assistant

## 2015-11-02 VITALS — BP 134/91 | HR 94 | Temp 98.2°F | Resp 16 | Ht 66.0 in | Wt 243.4 lb

## 2015-11-02 DIAGNOSIS — R609 Edema, unspecified: Secondary | ICD-10-CM

## 2015-11-02 DIAGNOSIS — J019 Acute sinusitis, unspecified: Secondary | ICD-10-CM | POA: Diagnosis not present

## 2015-11-02 DIAGNOSIS — B9689 Other specified bacterial agents as the cause of diseases classified elsewhere: Secondary | ICD-10-CM

## 2015-11-02 DIAGNOSIS — R600 Localized edema: Secondary | ICD-10-CM | POA: Insufficient documentation

## 2015-11-02 MED ORDER — LEVOFLOXACIN 500 MG PO TABS: 500.0000 mg | ORAL_TABLET | Freq: Every day | ORAL | Status: DC

## 2015-11-02 NOTE — Assessment & Plan Note (Signed)
Discussed change in timing of BP medications. Supportive measures discussed.

## 2015-11-02 NOTE — Progress Notes (Signed)
Patient presents to clinic today c/o 1 week of sinus headache with tooth pain, bilateral ear pain. Denies fever, chills, chest congestion. Notes dry cough with post-nasal drip. Denies recent travel or sick contact.  Has history of sinus infections. Has taken zyrtec with little relief of symptoms.  Patient also complains of swelling in legs bilaterally that is noted in the evening after working all day. Is taking her BP medications (Benicar HCT) at bedtime. Patient denies chest pain, palpitations, lightheadedness, dizziness, vision changes or frequent headaches.  BP Readings from Last 3 Encounters:  11/02/15 134/91  09/12/15 132/90  07/04/15 140/80    Past Medical History  Diagnosis Date  . Arthritis     knees  . Hypertension   . Migraines   . Fatty liver     [place]  . Gallstones     [place]  . Diabetes mellitus without complication [facility])     Current Outpatient Prescriptions on File Prior to Visit  Medication Sig Dispense Refill  . BENICAR HCT 20-12.5 MG per tablet TAKE 1 TABLET BY MOUTH DAILY. 90 tablet 1  . glucose blood (ONETOUCH VERIO) test strip Check Blood sugar twice daily. Onetouch Verio test strips 200 each 12  . Krill Oil Omega-3 300 MG CAPS Take by mouth.     No current facility-administered medications on file prior to visit.    Allergies  Allergen Reactions  . Cephalexin Hives  . Penicillins Rash    Family History  Problem Relation Age of Onset  . Lung cancer    . Stroke Father   . Arthritis Father   . Cancer Father     lung  . Arthritis Mother   . Hyperlipidemia Mother   . Alcohol abuse Maternal Aunt   . Diabetes Maternal Aunt   . Arthritis Maternal Grandmother   . Heart disease Maternal Grandmother   . Hyperlipidemia Maternal Grandmother   . Hypertension Maternal Grandmother   . Arthritis Maternal Grandfather   . Heart disease Maternal Grandfather     Social History   Social History  . Marital Status: Single    Spouse Name: N/A  . Number of  Children: 0  . 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 Smoking status: Former Smoker    Quit date: 12/12/2011  . Smokeless tobacco: Never Used  . Alcohol Use: Yes     Comment: rarely   . Drug Use: No  . Sexual Activity: Not Asked   Other Topics Concern  . None   Social History Narrative   Single-life partner   Review of Systems - See HPI.  All other ROS are negative.  BP 134/91 mmHg  Pulse 94  Temp(Src) 98.2 F (36.8 C) (Oral)  Resp 16  Ht 5\' 6"  (1.676 m)  Wt 243 lb 6 oz (110.394 kg)  BMI 39.30 kg/m2  SpO2 100%  LMP 10/19/2015  Physical Exam  Constitutional: She is oriented to person, place, and time and well-developed, well-nourished, and in no distress.  HENT:  Head: Normocephalic and atraumatic.  Right Ear: Tympanic membrane normal.  Left Ear: Tympanic membrane normal.  + TTP of sinuses  Eyes: Conjunctivae are normal.  Neck: Neck supple.  Cardiovascular: Normal rate, regular rhythm, normal heart sounds and intact distal pulses.   Pulses:      Dorsalis pedis pulses are 2+ on the right side, and 2+ on the left side.  Posterior tibial pulses are 2+ on the right side, and 2+ on the left side.  No edema noted  Pulmonary/Chest: Effort normal and breath sounds normal. No respiratory distress. She has no wheezes. She has no rales. She exhibits no tenderness.  Neurological: She is alert and oriented to person, place, and time.  Skin: Skin is warm and dry. No rash noted.  Psychiatric: Affect normal.  Vitals reviewed.   No results found for this or any previous visit (from the past 2160 hour(s)).  Assessment/Plan: Acute bacterial sinusitis Rx Levaquin.  Increase fluids.  Rest.  Saline nasal spray.  Probiotic.  Mucinex as directed.  Humidifier in bedroom.  Call or return to clinic if symptoms are not improving.   Referral to ENT placed due to recurrent infections.   Peripheral edema Discussed  change in timing of BP medications. Supportive measures discussed.

## 2015-11-02 NOTE — Assessment & Plan Note (Signed)
Rx Levaquin.  Increase fluids.  Rest.  Saline nasal spray.  Probiotic.  Mucinex as directed.  Humidifier in bedroom.  Call or return to clinic if symptoms are not improving.   Referral to ENT placed due to recurrent infections.

## 2015-11-02 NOTE — Patient Instructions (Signed)
Please take antibiotic as directed.  Increase fluid intake.  Use Saline nasal spray.  Take a daily multivitamin. Use Tylenol sinus for headache.  Place a humidifier in the bedroom.  Please call or return clinic if symptoms are not improving.  Start taking your BP medication each morning. Limit salt intake and keep legs moving to help with swelling.  Sinusitis Sinusitis is redness, soreness, and swelling (inflammation) of the paranasal sinuses. Paranasal sinuses are air pockets within the bones of your face (beneath the eyes, the middle of the forehead, or above the eyes). In healthy paranasal sinuses, mucus is able to drain out, and air is able to circulate through them by way of your nose. However, when your paranasal sinuses are inflamed, mucus and air can become trapped. This can allow bacteria and other germs to grow and cause infection. Sinusitis can develop quickly and last only a short time (acute) or continue over a long period (chronic). Sinusitis that lasts for more than 12 weeks is considered chronic.  CAUSES  Causes of sinusitis include:  Allergies.  Structural abnormalities, such as displacement of the cartilage that separates your nostrils (deviated septum), which can decrease the air flow through your nose and sinuses and affect sinus drainage.  Functional abnormalities, such as when the small hairs (cilia) that line your sinuses and help remove mucus do not work properly or are not present. SYMPTOMS  Symptoms of acute and chronic sinusitis are the same. The primary symptoms are pain and pressure around the affected sinuses. Other symptoms include:  Upper toothache.  Earache.  Headache.  Bad breath.  Decreased sense of smell and taste.  A cough, which worsens when you are lying flat.  Fatigue.  Fever.  Thick drainage from your nose, which often is green and may contain pus (purulent).  Swelling and warmth over the affected sinuses. DIAGNOSIS  Your caregiver will  perform a physical exam. During the exam, your caregiver may:  Look in your nose for signs of abnormal growths in your nostrils (nasal polyps).  Tap over the affected sinus to check for signs of infection.  View the inside of your sinuses (endoscopy) with a special imaging device with a light attached (endoscope), which is inserted into your sinuses. If your caregiver suspects that you have chronic sinusitis, one or more of the following tests may be recommended:  Allergy tests.  Nasal culture A sample of mucus is taken from your nose and sent to a lab and screened for bacteria.  Nasal cytology A sample of mucus is taken from your nose and examined by your caregiver to determine if your sinusitis is related to an allergy. TREATMENT  Most cases of acute sinusitis are related to a viral infection and will resolve on their own within 10 days. Sometimes medicines are prescribed to help relieve symptoms (pain medicine, decongestants, nasal steroid sprays, or saline sprays).  However, for sinusitis related to a bacterial infection, your caregiver will prescribe antibiotic medicines. These are medicines that will help kill the bacteria causing the infection.  Rarely, sinusitis is caused by a fungal infection. In theses cases, your caregiver will prescribe antifungal medicine. For some cases of chronic sinusitis, surgery is needed. Generally, these are cases in which sinusitis recurs more than 3 times per year, despite other treatments. HOME CARE INSTRUCTIONS   Drink plenty of water. Water helps thin the mucus so your sinuses can drain more easily.  Use a humidifier.  Inhale steam 3 to 4 times a day (for  example, sit in the bathroom with the shower running).  Apply a warm, moist washcloth to your face 3 to 4 times a day, or as directed by your caregiver.  Use saline nasal sprays to help moisten and clean your sinuses.  Take over-the-counter or prescription medicines for pain, discomfort, or  fever only as directed by your caregiver. SEEK IMMEDIATE MEDICAL CARE IF:  You have increasing pain or severe headaches.  You have nausea, vomiting, or drowsiness.  You have swelling around your face.  You have vision problems.  You have a stiff neck.  You have difficulty breathing. MAKE SURE YOU:   Understand these instructions.  Will watch your condition.  Will get help right away if you are not doing well or get worse. Document Released: 12/15/2005 Document Revised: 03/08/2012 Document Reviewed: 12/30/2011 [facility] Patient Information 2014 [place], [place].

## 2016-01-09 ENCOUNTER — Encounter: Payer: Self-pay | Admitting: Medical

## 2016-01-09 ENCOUNTER — Ambulatory Visit (INDEPENDENT_AMBULATORY_CARE_PROVIDER_SITE_OTHER): Payer: BLUE CROSS/BLUE SHIELD | Admitting: Medical

## 2016-01-09 VITALS — BP 128/68 | HR 93 | Temp 97.8°F | Ht 66.0 in | Wt 221.6 lb

## 2016-01-09 DIAGNOSIS — J0100 Acute maxillary sinusitis, unspecified: Secondary | ICD-10-CM

## 2016-01-09 DIAGNOSIS — R05 Cough: Secondary | ICD-10-CM

## 2016-01-09 DIAGNOSIS — R059 Cough, unspecified: Secondary | ICD-10-CM

## 2016-01-09 MED ORDER — FLUTICASONE PROPIONATE 50 MCG/ACT NA SUSP: 2.0000 | Freq: Every day | NASAL | Status: DC

## 2016-01-09 MED ORDER — CLARITHROMYCIN ER 500 MG PO TB24: 1000.0000 mg | ORAL_TABLET | Freq: Every day | ORAL | Status: DC

## 2016-01-09 NOTE — Progress Notes (Signed)
Subjective:    Patient ID: Monica Perkins, female    DOB: [birth year], 35 y.o.   MRN: [id number]  HPI   Pt in nasal congestion, sinus pressure, cough and ear ache. Pt states 3 days of symptoms but gradually worsening. Occasionally productive cough.   Some sneezing. No itchy eyes.  LMP-3 wks ago.    Review of Systems  Constitutional: Negative for fever, chills and fatigue.  HENT: Positive for congestion, postnasal drip and sinus pressure.   Respiratory: Positive for cough. Negative for chest tightness, shortness of breath and wheezing.   Cardiovascular: Negative for chest pain and palpitations.  Musculoskeletal: Negative for back pain.  Neurological: Negative for dizziness and headaches.  Hematological: Negative for adenopathy. Does not bruise/bleed easily.  Psychiatric/Behavioral: Negative for behavioral problems and confusion.     Past Medical History  Diagnosis Date  . Arthritis     knees  . Hypertension   . Migraines   . Fatty liver     [place]  . Gallstones     [place]  . Diabetes mellitus without complication [facility])     Social History   Social History  . Marital Status: Single    Spouse Name: N/A  . Number of Children: 0  . 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 Smokeless tobacco: Never Used  . Alcohol Use: Yes     Comment: rarely   . Drug Use: No  . Sexual Activity: Not on file   Other Topics Concern  . Not on file   Social History Narrative   [place] partner    Past Surgical History  Procedure Laterality Date  . Tonsillectomy    . Knee arthroscopy  10-2010    B knee surgeries, several    Family History  Problem Relation Age of Onset  . Lung cancer    . Stroke Father   . Arthritis Father   . Cancer Father     lung  . Arthritis Mother   . Hyperlipidemia Mother   . Alcohol abuse Maternal Aunt   . Diabetes Maternal  Aunt   . Arthritis Maternal Grandmother   . Heart disease Maternal Grandmother   . Hyperlipidemia Maternal Grandmother   . Hypertension Maternal Grandmother   . Arthritis Maternal Grandfather   . Heart disease Maternal Grandfather     Allergies  Allergen Reactions  . Cephalexin Hives  . Penicillins Rash    Current Outpatient Prescriptions on File Prior to Visit  Medication Sig Dispense Refill  . BENICAR HCT 20-12.5 MG per tablet TAKE 1 TABLET BY MOUTH DAILY. 90 tablet 1  . glucose blood (ONETOUCH VERIO) test strip Check Blood sugar twice daily. Onetouch Verio test strips 200 each 12  . Krill Oil Omega-3 300 MG CAPS Take by mouth.    . levofloxacin (LEVAQUIN) 500 MG tablet Take 1 tablet (500 mg total) by mouth daily. 7 tablet 0   No current facility-administered medications on file prior to visit.    BP 128/68 mmHg  Pulse 93  Temp(Src) 97.8 F (36.6 C) (Oral)  Ht 5\' 6"  (1.676 m)  Wt 221 lb 9.6 oz (100.517 kg)  BMI 35.78 kg/m2  SpO2 97%       Objective:   Physical Exam  General  Mental Status - Alert. General Appearance - Well groomed. Not in acute distress.  Skin  Rashes- No Rashes.  HEENT Head- Normal. Ear Auditory Canal - Left- Normal. Right - Normal.Tympanic Membrane- Left- Normal. Right- Normal. Eye Sclera/Conjunctiva- Left- Normal. Right- Normal. Nose & Sinuses Nasal Mucosa- Left-  Boggy and Congested. Right-  Boggy and  Congested.Bilateral  maxillary and frontal sinus pressure.(maxillary sinus pressure is worst) Mouth & Throat Lips: Upper Lip- Normal: no dryness, cracking, pallor, cyanosis, or vesicular eruption. Lower Lip-Normal: no dryness, cracking, pallor, cyanosis or vesicular eruption. Buccal Mucosa- Bilateral- No Aphthous ulcers. Oropharynx- No Discharge or Erythema. Tonsils: Characteristics- Bilateral- No Erythema or Congestion. Size/Enlargement- Bilateral- No enlargement. Discharge- bilateral-None.  Neck Neck- Supple. No Masses.   Chest and  Lung Exam Auscultation: Breath Sounds:-Clear even and unlabored.  Cardiovascular Auscultation:Rythm- Regular, rate and rhythm. Murmurs & Other Heart Sounds:Ausculatation of the heart reveal- No Murmurs.  Lymphatic Head & Neck General Head & Neck Lymphatics: Bilateral: Description- No Localized lymphadenopathy.       Assessment & Plan:  Your appear to have a sinus infection. I am prescribing antibiotic biaxin  for the infection. To help with the nasal congestion I prescribed flonase  nasal steroid. For your associated cough continue your cough medicine.  Rest, hydrate, tylenol for fever.  Follow up in 7 days or as needed.

## 2016-01-09 NOTE — Patient Instructions (Signed)
Your appear to have a sinus infection. I am prescribing antibiotic biaxin  for the infection. To help with the nasal congestion I prescribed flonase  nasal steroid. For your associated cough continue your cough medicine.  Rest, hydrate, tylenol for fever.  Follow up in 7 days or as needed.

## 2016-01-09 NOTE — Progress Notes (Signed)
Pre visit review using our clinic review tool, if applicable. No additional management support is needed unless otherwise documented below in the visit note. 

## 2016-03-03 ENCOUNTER — Encounter: Payer: Self-pay | Admitting: Family Medicine

## 2016-03-03 MED ORDER — OLMESARTAN MEDOXOMIL-HCTZ 20-12.5 MG PO TABS: 1.0000 | ORAL_TABLET | Freq: Every day | ORAL | Status: DC

## 2016-03-03 NOTE — Telephone Encounter (Signed)
Please schedule the patient a follow up with Dr.Lowne.    KP

## 2016-03-18 ENCOUNTER — Encounter: Payer: Self-pay | Admitting: Family Medicine

## 2016-03-18 ENCOUNTER — Ambulatory Visit (INDEPENDENT_AMBULATORY_CARE_PROVIDER_SITE_OTHER): Payer: BLUE CROSS/BLUE SHIELD | Admitting: Family Medicine

## 2016-03-18 VITALS — BP 119/77 | HR 82 | Temp 98.6°F | Ht 66.0 in | Wt 215.0 lb

## 2016-03-18 DIAGNOSIS — J302 Other seasonal allergic rhinitis: Secondary | ICD-10-CM

## 2016-03-18 DIAGNOSIS — Z0000 Encounter for general adult medical examination without abnormal findings: Secondary | ICD-10-CM

## 2016-03-18 DIAGNOSIS — Z8639 Personal history of other endocrine, nutritional and metabolic disease: Secondary | ICD-10-CM | POA: Diagnosis not present

## 2016-03-18 DIAGNOSIS — E785 Hyperlipidemia, unspecified: Secondary | ICD-10-CM | POA: Diagnosis not present

## 2016-03-18 DIAGNOSIS — I10 Essential (primary) hypertension: Secondary | ICD-10-CM | POA: Diagnosis not present

## 2016-03-18 MED ORDER — DESLORATADINE 5 MG PO TABS: 5.0000 mg | ORAL_TABLET | Freq: Every day | ORAL | Status: AC

## 2016-03-18 MED ORDER — LEVOCETIRIZINE DIHYDROCHLORIDE 5 MG PO TABS: 5.0000 mg | ORAL_TABLET | Freq: Every evening | ORAL | Status: DC

## 2016-03-18 MED ORDER — FLUTICASONE PROPIONATE 50 MCG/ACT NA SUSP
2.0000 | Freq: Every day | NASAL | Status: AC
Start: 2016-03-18 — End: ?

## 2016-03-18 MED ORDER — OLMESARTAN MEDOXOMIL-HCTZ 20-12.5 MG PO TABS: 1.0000 | ORAL_TABLET | Freq: Every day | ORAL | Status: DC

## 2016-03-18 NOTE — Progress Notes (Signed)
Pre visit review using our clinic review tool, if applicable. No additional management support is needed unless otherwise documented below in the visit note. 

## 2016-03-18 NOTE — Patient Instructions (Signed)

## 2016-03-19 LAB — CBC WITH DIFFERENTIAL/PLATELET
BASOS ABS: 0 10*3/uL (ref 0.0–0.1)
Basophils Relative: 0.4 % (ref 0.0–3.0)
EOS ABS: 0.2 10*3/uL (ref 0.0–0.7)
Eosinophils Relative: 1.8 % (ref 0.0–5.0)
HCT: 39.6 % (ref 36.0–46.0)
Hemoglobin: 13.6 g/dL (ref 12.0–15.0)
LYMPHS ABS: 2.5 10*3/uL (ref 0.7–4.0)
Lymphocytes Relative: 22.3 % (ref 12.0–46.0)
MCHC: 34.4 g/dL (ref 30.0–36.0)
MCV: 92 fl (ref 78.0–100.0)
Monocytes Absolute: 0.1 10*3/uL (ref 0.1–1.0)
Monocytes Relative: 0.5 % — ABNORMAL LOW (ref 3.0–12.0)
NEUTROS ABS: 8.3 10*3/uL — AB (ref 1.4–7.7)
NEUTROS PCT: 75 % (ref 43.0–77.0)
PLATELETS: 252 10*3/uL (ref 150.0–400.0)
RBC: 4.3 Mil/uL (ref 3.87–5.11)
RDW: 13 % (ref 11.5–15.5)
WBC: 11 10*3/uL — ABNORMAL HIGH (ref 4.0–10.5)

## 2016-03-19 LAB — HEMOGLOBIN A1C: HEMOGLOBIN A1C: 5.3 % (ref 4.6–6.5)

## 2016-03-19 LAB — COMPREHENSIVE METABOLIC PANEL
ALT: 18 U/L (ref 0–35)
AST: 16 U/L (ref 0–37)
Albumin: 4.7 g/dL (ref 3.5–5.2)
Alkaline Phosphatase: 66 U/L (ref 39–117)
BILIRUBIN TOTAL: 0.6 mg/dL (ref 0.2–1.2)
BUN: 14 mg/dL (ref 6–23)
CALCIUM: 10.1 mg/dL (ref 8.4–10.5)
CO2: 30 meq/L (ref 19–32)
CREATININE: 0.74 mg/dL (ref 0.40–1.20)
Chloride: 104 mEq/L (ref 96–112)
GFR: 96.28 mL/min (ref >60.00)
GLUCOSE: 102 mg/dL — AB (ref 70–99)
Potassium: 4.3 mEq/L (ref 3.5–5.1)
Sodium: 141 mEq/L (ref 135–145)
Total Protein: 7.9 g/dL (ref 6.0–8.3)

## 2016-03-19 LAB — LIPID PANEL
CHOL/HDL RATIO: 4
Cholesterol: 169 mg/dL (ref 0–200)
HDL: 42.2 mg/dL (ref >39.00)
LDL CALC: 106 mg/dL — AB (ref 0–99)
NonHDL: 126.72
Triglycerides: 104 mg/dL (ref 0.0–149.0)
VLDL: 20.8 mg/dL (ref 0.0–40.0)

## 2016-03-19 LAB — TSH: TSH: 0.93 u[IU]/mL (ref 0.35–4.50)

## 2016-03-19 NOTE — Assessment & Plan Note (Signed)
Diet controlled con't with diet and exercise

## 2016-03-19 NOTE — Assessment & Plan Note (Signed)
Con' t benicar bp stable rto 6 months Pt is moving to [place]

## 2016-03-19 NOTE — Progress Notes (Signed)
Patient ID: Monica Perkins, female    DOB: 09-10-1981  Age: 35 y.o. MRN: [id number]    Subjective:  Subjective HPI IZZA WARBINGTON presents for f/u dm, cholessterol, and htn.   No complaints.  She is moving to [place] in next few months.    Review of Systems  Constitutional: Negative for diaphoresis, appetite change, fatigue and unexpected weight change.  Eyes: Negative for pain, redness and visual disturbance.  Respiratory: Negative for cough, chest tightness, shortness of breath and wheezing.   Cardiovascular: Negative for chest pain, palpitations and leg swelling.  Endocrine: Negative for cold intolerance, heat intolerance, polydipsia, polyphagia and polyuria.  Genitourinary: Negative for dysuria, frequency and difficulty urinating.  Neurological: Negative for dizziness, light-headedness, numbness and headaches.    History Past Medical History  Diagnosis Date  . Arthritis     knees  . Hypertension   . Migraines   . Fatty liver     [place]  . Gallstones     [place]  . Diabetes mellitus without complication [facility])     She has past surgical history that includes Tonsillectomy and Knee arthroscopy (10-2010).   Her family history includes Alcohol abuse in her maternal aunt; Arthritis in her father, maternal grandfather, maternal grandmother, and mother; Cancer in her father; Diabetes in her maternal aunt; Heart disease in her maternal grandfather and maternal grandmother; Hyperlipidemia in her maternal grandmother and mother; Hypertension in her maternal grandmother; Stroke in her father.She reports that she quit smoking about 4 years ago. She has never used smokeless tobacco. She reports that she drinks alcohol. She reports that she does not use illicit drugs.  Current Outpatient Prescriptions on File Prior to Visit  Medication Sig Dispense Refill  . glucose blood (ONETOUCH VERIO) test strip Check Blood sugar twice daily. Onetouch Verio test strips 200 each 12  . Krill Oil Omega-3 300 MG CAPS  Take by mouth.     No current facility-administered medications on file prior to visit.     Objective:  Objective Physical Exam  Constitutional: She is oriented to person, place, and time. She appears well-developed and well-nourished.  HENT:  Head: Normocephalic and atraumatic.  Eyes: Conjunctivae and EOM are normal.  Neck: Normal range of motion. Neck supple. No JVD present. Carotid bruit is not present. No thyromegaly present.  Cardiovascular: Normal rate, regular rhythm and normal heart sounds.   No murmur heard. Pulmonary/Chest: Effort normal and breath sounds normal. No respiratory distress. She has no wheezes. She has no rales. She exhibits no tenderness.  Musculoskeletal: She exhibits no edema.  Neurological: She is alert and oriented to person, place, and time.  Psychiatric: She has a normal mood and affect.  Nursing note and vitals reviewed.  BP 119/77 mmHg  Pulse 82  Temp(Src) 98.6 F (37 C) (Oral)  Ht 5\' 6"  (1.676 m)  Wt 215 lb (97.523 kg)  BMI 34.72 kg/m2  SpO2 99% Wt Readings from Last 3 Encounters:  03/18/16 215 lb (97.523 kg)  01/09/16 221 lb 9.6 oz (100.517 kg)  11/02/15 243 lb 6 oz (110.394 kg)     Lab Results  Component Value Date   WBC 11.0* 03/18/2016   HGB 13.6 03/18/2016   HCT 39.6 03/18/2016   PLT 252.0 03/18/2016   GLUCOSE 102* 03/18/2016   CHOL 169 03/18/2016   TRIG 104.0 03/18/2016   HDL 42.20 03/18/2016   LDLDIRECT 135.0 03/02/2015   LDLCALC 106* 03/18/2016   ALT 18 03/18/2016   AST 16 03/18/2016   NA  141 03/18/2016   K 4.3 03/18/2016   CL 104 03/18/2016   CREATININE 0.74 03/18/2016   BUN 14 03/18/2016   CO2 30 03/18/2016   TSH 0.93 03/18/2016   INR 0.98 04/26/2013   HGBA1C 5.3 03/18/2016   MICROALBUR 0.9 04/12/2014    Dg Chest 2 View  10/03/2014  CLINICAL DATA:  Cough and wheezing 10 days EXAM: CHEST  2 VIEW COMPARISON:  03/05/2011 FINDINGS: The heart size and mediastinal contours are within normal limits. Both lungs are  clear. The visualized skeletal structures are unremarkable. IMPRESSION: No active cardiopulmonary disease. Electronically Signed   By: Franchot Gallo M.D.   On: 10/03/2014 14:48     Assessment & Plan:  Plan I have discontinued Ms. Spease's clarithromycin and levocetirizine. I am also having her start on desloratadine. Additionally, I am having her maintain her Krill Oil Omega-3, glucose blood, fluticasone, and olmesartan-hydrochlorothiazide.  Meds ordered this encounter  Medications  . fluticasone (FLONASE) 50 MCG/ACT nasal spray    Sig: Place 2 sprays into both nostrils daily.    Dispense:  16 g    Refill:  1  . DISCONTD: levocetirizine (XYZAL) 5 MG tablet    Sig: Take 1 tablet (5 mg total) by mouth every evening.    Dispense:  30 tablet    Refill:  5  . olmesartan-hydrochlorothiazide (BENICAR HCT) 20-12.5 MG tablet    Sig: Take 1 tablet by mouth daily.    Dispense:  90 tablet    Refill:  3  . desloratadine (CLARINEX) 5 MG tablet    Sig: Take 1 tablet (5 mg total) by mouth daily.    Dispense:  30 tablet    Refill:  5    Problem List Items Addressed This Visit      Unprioritized   Essential hypertension - Primary    Con' t benicar bp stable rto 6 months Pt is moving to [place]      Relevant Medications   olmesartan-hydrochlorothiazide (BENICAR HCT) 20-12.5 MG tablet   Other Relevant Orders   Lipid panel (Completed)   Hemoglobin A1c (Completed)   Comprehensive metabolic panel (Completed)    Other Visit Diagnoses    Hyperlipidemia        Relevant Medications    olmesartan-hydrochlorothiazide (BENICAR HCT) 20-12.5 MG tablet    Other Relevant Orders    Lipid panel (Completed)    Hemoglobin A1c (Completed)    Comprehensive metabolic panel (Completed)    History of diabetes mellitus, type II        Relevant Orders    Lipid panel (Completed)    Hemoglobin A1c (Completed)    Comprehensive metabolic panel (Completed)    Seasonal allergies        Relevant Medications     fluticasone (FLONASE) 50 MCG/ACT nasal spray    desloratadine (CLARINEX) 5 MG tablet    Preventative health care        Relevant Orders    CBC with Differential/Platelet (Completed)    TSH (Completed)       Follow-up: Return if symptoms worsen or fail to improve.  Garnet Koyanagi, DO

## 2016-03-26 ENCOUNTER — Other Ambulatory Visit: Payer: Self-pay

## 2016-03-26 DIAGNOSIS — D72829 Elevated white blood cell count, unspecified: Secondary | ICD-10-CM

## 2016-03-30 ENCOUNTER — Encounter: Payer: Self-pay | Admitting: Family Medicine

## 2016-04-01 ENCOUNTER — Other Ambulatory Visit (INDEPENDENT_AMBULATORY_CARE_PROVIDER_SITE_OTHER): Payer: BLUE CROSS/BLUE SHIELD

## 2016-04-01 DIAGNOSIS — D72829 Elevated white blood cell count, unspecified: Secondary | ICD-10-CM | POA: Diagnosis not present

## 2016-04-01 LAB — CBC WITH DIFFERENTIAL/PLATELET
BASOS ABS: 0.1 10*3/uL (ref 0.0–0.1)
Basophils Relative: 0.5 % (ref 0.0–3.0)
Eosinophils Absolute: 0.2 10*3/uL (ref 0.0–0.7)
Eosinophils Relative: 1.4 % (ref 0.0–5.0)
HEMATOCRIT: 39 % (ref 36.0–46.0)
HEMOGLOBIN: 13.3 g/dL (ref 12.0–15.0)
LYMPHS PCT: 27.1 % (ref 12.0–46.0)
Lymphs Abs: 3.2 10*3/uL (ref 0.7–4.0)
MCHC: 34.1 g/dL (ref 30.0–36.0)
MCV: 91.9 fl (ref 78.0–100.0)
MONOS PCT: 4.2 % (ref 3.0–12.0)
Monocytes Absolute: 0.5 10*3/uL (ref 0.1–1.0)
Neutro Abs: 7.8 10*3/uL — ABNORMAL HIGH (ref 1.4–7.7)
Neutrophils Relative %: 66.8 % (ref 43.0–77.0)
Platelets: 228 10*3/uL (ref 150.0–400.0)
RBC: 4.24 Mil/uL (ref 3.87–5.11)
RDW: 12.9 % (ref 11.5–15.5)
WBC: 11.6 10*3/uL — AB (ref 4.0–10.5)

## 2016-04-08 ENCOUNTER — Encounter: Payer: Self-pay | Admitting: Podiatry

## 2016-04-08 ENCOUNTER — Ambulatory Visit (INDEPENDENT_AMBULATORY_CARE_PROVIDER_SITE_OTHER): Payer: BLUE CROSS/BLUE SHIELD | Admitting: Podiatry

## 2016-04-08 VITALS — BP 124/74 | HR 70 | Ht 66.0 in | Wt 215.0 lb

## 2016-04-08 DIAGNOSIS — L600 Ingrowing nail: Secondary | ICD-10-CM | POA: Diagnosis not present

## 2016-04-08 DIAGNOSIS — M25571 Pain in right ankle and joints of right foot: Secondary | ICD-10-CM | POA: Diagnosis not present

## 2016-04-08 DIAGNOSIS — M205X9 Other deformities of toe(s) (acquired), unspecified foot: Secondary | ICD-10-CM

## 2016-04-08 NOTE — Progress Notes (Signed)
SUBJECTIVE: 35 y.o. year old female presents with tender nail border left great toe lateral border, started a month ago.  Right great toe joint hurts after been on for exercise.   REVIEW OF SYSTEMS: Constitutional: negative Eyes: negative Ears, nose, mouth, throat, and face: negative Respiratory: negative Cardiovascular: negative Gastrointestinal: negative Genitourinary:negative Musculoskeletal:Weak and painful knees. Neurological: negative Endocrine: negative Allergic/Immunologic: negative  OBJECTIVE: DERMATOLOGIC EXAMINATION: Curbed ingrown nails on left great toe both borders and 2nd toe nails without infection.  Positive for mild plantar callus under 5th MPJ bilateral.  VASCULAR EXAMINATION OF LOWER LIMBS: Pedal pulses: All pedal pulses are palpable with normal pulsation.  Temperature gradient from tibial crest to dorsum of foot is within normal bilateral.  NEUROLOGIC EXAMINATION OF THE LOWER LIMBS: All epicritic and tactile sensations grossly intact.  Sharp and Dull discriminatory sensations at the plantar ball of hallux is intact bilateral.   MUSCULOSKELETAL EXAMINATION: Positive for hypermobile first ray bilateral. Forefoot varus. Long first digits. Hallux valgus bilateral.  Limited dorsiflexion of the first MPJ with forefoot loading. Enlarged first MPJ right.  ASSESSMENT: Ingrown nail symptomatic left great toe lateral border. Hallux limitus right with arthropathy first MPJ. Metatarsus primus elevatus bilateral.  PLAN: Reviewed clinical findings and available treatment options, ingrown nail surgery for symptomatic ingrown nail, custom orthotics for Hallux limitus with joint pain. Patient will return for ingrown nails surgery after her moving.

## 2016-04-08 NOTE — Patient Instructions (Signed)
Seen for painful nail left great toe and joint pain on right great toe. Noted of curbed nail on left great toe that need ingrown nail surgery. Noted of displacing first metatarsal bone with weight shifting to lateral column. Need custom orthotics for faulty biomechanics and first MPJ joint pain. Return after "moving" for ingrown nail surgery. Will contact insurance co for custom orthotics.

## 2016-10-16 ENCOUNTER — Encounter: Payer: Self-pay | Admitting: Family Medicine

## 2016-10-16 DIAGNOSIS — I10 Essential (primary) hypertension: Secondary | ICD-10-CM

## 2016-10-17 MED ORDER — OLMESARTAN MEDOXOMIL-HCTZ 20-12.5 MG PO TABS: 1.0000 | ORAL_TABLET | Freq: Every day | ORAL | 1 refills | Status: DC

## 2017-04-26 ENCOUNTER — Other Ambulatory Visit: Payer: Self-pay | Admitting: Family Medicine

## 2017-04-26 DIAGNOSIS — I10 Essential (primary) hypertension: Secondary | ICD-10-CM
